# Patient Record
Sex: Male | Born: 1975 | Race: Black or African American | Hispanic: No | Marital: Single | Smoking: Current some day smoker
Health system: Southern US, Community
[De-identification: ages and names within clinical notes are randomized; demographics above are authoritative.]

---

## 1998-01-25 ENCOUNTER — Emergency Department (HOSPITAL_COMMUNITY): Admission: EM | Admit: 1998-01-25 | Discharge: 1998-01-25 | Payer: Self-pay | Admitting: Emergency Medicine

## 1998-02-06 ENCOUNTER — Emergency Department (HOSPITAL_COMMUNITY): Admission: EM | Admit: 1998-02-06 | Discharge: 1998-02-06 | Payer: Self-pay | Admitting: Emergency Medicine

## 2005-11-11 ENCOUNTER — Emergency Department: Payer: Self-pay | Admitting: Unknown Physician Specialty

## 2011-01-09 ENCOUNTER — Emergency Department (HOSPITAL_COMMUNITY)
Admission: EM | Admit: 2011-01-09 | Discharge: 2011-01-10 | Disposition: A | Discharge status: Home / Self Care | Payer: Self-pay | Attending: Emergency Medicine | Admitting: Emergency Medicine

## 2011-01-09 DIAGNOSIS — R110 Nausea: Secondary | ICD-10-CM | POA: Insufficient documentation

## 2011-01-09 DIAGNOSIS — H81399 Other peripheral vertigo, unspecified ear: Secondary | ICD-10-CM | POA: Insufficient documentation

## 2011-01-09 LAB — CBC
Hemoglobin: 15.4 g/dL (ref 13.0–17.0)
MCH: 32.6 pg (ref 26.0–34.0)
MCV: 92.4 fL (ref 78.0–100.0)
RBC: 4.73 MIL/uL (ref 4.22–5.81)

## 2011-01-09 LAB — DIFFERENTIAL
Lymphs Abs: 3.7 10*3/uL (ref 0.7–4.0)
Monocytes Relative: 9 % (ref 3–12)
Neutro Abs: 2.2 10*3/uL (ref 1.7–7.7)
Neutrophils Relative %: 33 % — ABNORMAL LOW (ref 43–77)

## 2011-01-09 LAB — POCT I-STAT, CHEM 8
BUN: 11 mg/dL (ref 6–23)
Calcium, Ion: 1.15 mmol/L (ref 1.12–1.32)
Chloride: 105 mEq/L (ref 96–112)
Creatinine, Ser: 1.2 mg/dL (ref 0.4–1.5)

## 2011-01-09 LAB — POCT CARDIAC MARKERS: Troponin i, poc: <0.05 ng/mL (ref 0.00–0.09)

## 2011-08-31 ENCOUNTER — Emergency Department (HOSPITAL_COMMUNITY)
Admission: EM | Admit: 2011-08-31 | Discharge: 2011-08-31 | Disposition: A | Discharge status: Home / Self Care | Payer: Self-pay | Attending: Emergency Medicine | Admitting: Emergency Medicine

## 2011-08-31 ENCOUNTER — Encounter (HOSPITAL_COMMUNITY): Payer: Self-pay | Admitting: Emergency Medicine

## 2011-08-31 ENCOUNTER — Encounter: Payer: Self-pay | Admitting: Emergency Medicine

## 2011-08-31 DIAGNOSIS — R21 Rash and other nonspecific skin eruption: Secondary | ICD-10-CM | POA: Insufficient documentation

## 2011-08-31 DIAGNOSIS — B86 Scabies: Secondary | ICD-10-CM | POA: Insufficient documentation

## 2011-08-31 DIAGNOSIS — F17200 Nicotine dependence, unspecified, uncomplicated: Secondary | ICD-10-CM | POA: Insufficient documentation

## 2011-08-31 DIAGNOSIS — L299 Pruritus, unspecified: Secondary | ICD-10-CM | POA: Insufficient documentation

## 2011-08-31 MED ORDER — PERMETHRIN 5 % EX CREA: TOPICAL_CREAM | CUTANEOUS | Status: DC

## 2011-08-31 NOTE — ED Notes (Signed)
Patient stable and awaiting to be seen by md

## 2011-08-31 NOTE — ED Provider Notes (Signed)
History     CSN: [id number] Arrival date & time: 08/31/2011  2:44 AM   First MD Initiated Contact with Patient 08/31/11 0341      Chief Complaint  Patient presents with  . Rash    Patient is a 35 y.o. male presenting with rash.  Rash    History provided by the patient. Patient presents with complaints of pruritic rash to bilateral forearms and hands for the past few days. Patient denies any new foods, soaps, laundry detergent, clothing, jewelry. No known tick bites or environmental exposures to poison ivy or poison oak.  No pets in the home.  No other family members with similar symptoms. She denies fever, chills, sweats.  No chest tightness or shortness of breath.  History reviewed. No pertinent past medical history.  History reviewed. No pertinent past surgical history.  Family History  Problem Relation Age of Onset  . Diabetes Father     History  Substance Use Topics  . Smoking status: Current Everyday Smoker    Types: Cigars  . Smokeless tobacco: Not on file  . Alcohol Use: Yes     socially      Review of Systems  Constitutional: Negative for fever and chills.  Respiratory: Negative for shortness of breath, wheezing and stridor.   Cardiovascular: Negative for chest pain.  Skin: Positive for rash.  Neurological: Negative for headaches.  All other systems reviewed and are negative.    Allergies  Review of patient's allergies indicates no known allergies.  Home Medications  No current outpatient prescriptions on file.  BP 122/81  Pulse 81  Temp(Src) 98.5 F (36.9 C) (Oral)  Resp 20  SpO2 97%  Physical Exam  Constitutional: He is oriented to person, place, and time. He appears well-developed and well-nourished. No distress.  Cardiovascular: Normal rate.   No murmur heard. Pulmonary/Chest: Effort normal. No stridor.  Lymphadenopathy:    He has no cervical adenopathy.  Neurological: He is alert and oriented to person, place, and time.  Skin: Skin is  warm.       Multiple papular lesions on bilateral forearms following a linear pattern.  Similar smaller papules between webspace and fingers.  Skin otherwise normal. No erythema or induration. No erythematous streaks.  Psychiatric: He has a normal mood and affect.    ED Course  Procedures (including critical care time)  1. Scabies   2. Rash       MDM          Angus Seller, [place] 08/31/11 [phone]

## 2011-08-31 NOTE — ED Notes (Signed)
+   GC /Chlamydia Patient treated with Rocephin and Zithromax. DHHS letter faxed . Call and notify patient.

## 2011-08-31 NOTE — ED Notes (Signed)
Patient stable at discharge and discharged home.  States understanding of paperwork

## 2011-08-31 NOTE — ED Provider Notes (Signed)
Medical screening examination/treatment/procedure(s) were performed by non-physician practitioner and as supervising physician I was immediately available for consultation/collaboration.  Olivia Mackie, MD 08/31/11 [phone]

## 2011-08-31 NOTE — ED Notes (Signed)
Pt states Sunday he had a fine red rash here and there that itches  Pt states the rash has progressively gotten worse since  Pt states it is mostly on his arms

## 2011-09-03 ENCOUNTER — Emergency Department (HOSPITAL_COMMUNITY)
Admission: EM | Admit: 2011-09-03 | Discharge: 2011-09-04 | Disposition: A | Discharge status: Home / Self Care | Payer: Self-pay | Attending: Emergency Medicine | Admitting: Emergency Medicine

## 2011-09-03 ENCOUNTER — Encounter (HOSPITAL_COMMUNITY): Payer: Self-pay | Admitting: Emergency Medicine

## 2011-09-03 DIAGNOSIS — R21 Rash and other nonspecific skin eruption: Secondary | ICD-10-CM | POA: Insufficient documentation

## 2011-09-03 NOTE — ED Notes (Signed)
Pt. reports itchy rashes at both arms x 5days , denies new meds/no new food . Rsspirations unlabored . Prescribed with lotion at an urgent care last Friday with improvement.

## 2011-09-04 NOTE — ED Provider Notes (Signed)
History     CSN: [id number] Arrival date & time: 09/03/2011 10:11 PM   First MD Initiated Contact with Patient 09/03/11 2352      Chief Complaint  Patient presents with  . Rash    (Consider location/radiation/quality/duration/timing/severity/associated sxs/prior treatment) HPI 35 year old male presents to emergency department with report of resolving rash. Patient was seen emergency department on Friday with diagnosis scabies, applied permethrin cream on Saturday. Patient attempted to return to work today but was told that he needed a repeat visit and clearance prior to return to work. Patient reports rash has resolved, no further itching and lesions are drying up. He has no other complaints History reviewed. No pertinent past medical history.  History reviewed. No pertinent past surgical history.  Family History  Problem Relation Age of Onset  . Diabetes Father     History  Substance Use Topics  . Smoking status: Current Everyday Smoker    Types: Cigars  . Smokeless tobacco: Not on file  . Alcohol Use: Yes     socially      Review of Systems  All other systems reviewed and are negative.    Allergies  Review of patient's allergies indicates no known allergies.  Home Medications  No current outpatient prescriptions on file.  BP 123/79  Pulse 82  Temp(Src) 98.3 F (36.8 C) (Oral)  Resp 16  SpO2 97%  Physical Exam  Nursing note and vitals reviewed. Skin: Skin is warm and dry.       Patient with healing maculopapular rash to bilateral forearms. No new lesions noted no secondary infection    ED Course  Procedures (including critical care time)  Labs Reviewed - No data to display No results found.   No diagnosis found.    MDM  35 rolled male with resolving scabies infection. Will write patient a note for work        Olivia Mackie, MD 09/04/11 [phone]

## 2015-11-27 ENCOUNTER — Encounter (HOSPITAL_BASED_OUTPATIENT_CLINIC_OR_DEPARTMENT_OTHER): Payer: Self-pay | Admitting: *Deleted

## 2015-11-27 ENCOUNTER — Emergency Department (HOSPITAL_BASED_OUTPATIENT_CLINIC_OR_DEPARTMENT_OTHER)
Admission: EM | Admit: 2015-11-27 | Discharge: 2015-11-27 | Disposition: A | Discharge status: Home / Self Care | Payer: BLUE CROSS/BLUE SHIELD | Attending: Emergency Medicine | Admitting: Emergency Medicine

## 2015-11-27 DIAGNOSIS — R51 Headache: Secondary | ICD-10-CM | POA: Diagnosis not present

## 2015-11-27 DIAGNOSIS — F17210 Nicotine dependence, cigarettes, uncomplicated: Secondary | ICD-10-CM | POA: Insufficient documentation

## 2015-11-27 DIAGNOSIS — R519 Headache, unspecified: Secondary | ICD-10-CM

## 2015-11-27 MED ORDER — BUTALBITAL-APAP-CAFFEINE 50-325-40 MG PO TABS: 1.0000 | ORAL_TABLET | Freq: Four times a day (QID) | ORAL | Status: AC | PRN

## 2015-11-27 MED ORDER — KETOROLAC TROMETHAMINE 60 MG/2ML IM SOLN
60.0000 mg | Freq: Once | INTRAMUSCULAR | Status: AC
  Administered 2015-11-27: 60 mg via INTRAMUSCULAR
  Filled 2015-11-27: qty 2

## 2015-11-27 NOTE — ED Notes (Signed)
Headache since Thursday. 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

## 2015-11-27 NOTE — ED Provider Notes (Signed)
CSN: [id number]     Arrival date & time 11/27/15  1144 History   First MD Initiated Contact with Patient 11/27/15 1233     Chief Complaint  Patient presents with  . Headache     (Consider location/radiation/quality/duration/timing/severity/associated sxs/prior Treatment) HPI   40 year old male presenting for evaluation of a headache. Patient states since he started a new job approximately a year ago he has had recurrent headache. 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 He normally does not have any headache prior to this new job. His most recent headache has been ongoing for the past 4 days. He described a gradual onset of sharp throbbing pain to his frontal region and radiated as 6 out of 10 which has been persistent. Headaches shortly started after he went to work and did recall an exposed to a lot of burning fumes. He has not been back to work for the past 2 days but headache has not resolved. He did try taking ibuprofen last night without adequate relief. No complaints of fever, vision changes, URI symptoms, chest pain, shortness of breath, focal numbness or weakness, or rash. Denies any nausea vomiting or diarrhea. In the past this headache usually lasting from 3-7 days and this recurred every 3-4 months. This is the third time that he has this type of headache.      History reviewed. No pertinent past medical history. History reviewed. No pertinent past surgical history. Family History  Problem Relation Age of Onset  . Diabetes Father    Social History  Substance Use Topics  . Smoking status: Current Every Day Smoker    Types: Cigars  . Smokeless tobacco: Never Used  . Alcohol Use: Yes     Comment: socially    Review of Systems  All other systems reviewed and are negative.     Allergies  Review of patient's allergies indicates no known allergies.  Home Medications   Prior to Admission medications   Not on File   BP 136/79 mmHg   Pulse 78  Temp(Src) 98.7 F (37.1 C) (Oral)  Resp 18  Ht  (1.803 m)  Wt 135.172 kg  BMI 41.58 kg/m2  SpO2 100% Physical Exam  Constitutional: He is oriented to person, place, and time. He appears well-developed and well-nourished. No distress.  African-American male resting comfortably in no acute distress and nontoxic in appearance  HENT:  Head: Atraumatic.  Right Ear: External ear normal.  Left Ear: External ear normal.  Nose: Nose normal.  Mouth/Throat: Oropharynx is clear and moist. No oropharyngeal exudate.  Eyes: Conjunctivae and EOM are normal. Pupils are equal, round, and reactive to light.  Neck: Normal range of motion. Neck supple.  No nuchal rigidity  Cardiovascular: Normal rate and regular rhythm.   Pulmonary/Chest: Effort normal and breath sounds normal.  Abdominal: Soft. There is no tenderness.  Neurological: He is alert and oriented to person, place, and time. He has normal strength. No cranial nerve deficit or sensory deficit. GCS eye subscore is 4. GCS verbal subscore is 5. GCS motor subscore is 6.  Skin: No rash noted.  Psychiatric: He has a normal mood and affect.  Nursing note and vitals reviewed.   ED Course  Procedures (including critical care time)   MDM   Final diagnoses:  Bad headache    BP 136/79 mmHg  Pulse 78  Temp(Src) 98.7 F (37.1 C) (Oral)  Resp 18  Ht  (1.803 m)  Wt  135.172 kg  BMI 41.58 kg/m2  SpO2 100%  Headache similar to previous, no fever, neck stiffness, neuro findings or new symptoms to suggest more serious etiology.  I don't think SAH, ICH, meningitis, encephalitis, mass at this time.  No recent trauma.  I don't feel imaging necessary at this time.  Plan to control symptoms.   Fayrene Helper, PA-C 11/27/15 1309  Rolland Porter, MD 11/30/15 [phone]

## 2015-11-27 NOTE — Discharge Instructions (Signed)
Sinus Headache  A sinus headache happens when your sinuses become clogged or swollen. You may feel pain or pressure in your face, forehead, ears, or upper teeth. Sinus headaches can be mild or severe.  HOME CARE  · Take medicines only as told by your doctor.  · If you were given an antibiotic medicine, finish all of it even if you start to feel better.  · Use a nose spray if you feel stuffed up (congested).  · If told, apply a warm, moist washcloth to your face to help lessen pain.  GET HELP IF:  · You get headaches more than one time each week.  · Light or sound bothers you.  · You have a fever.  · You feel sick to your stomach (nauseous) or you throw up (vomit).  · Your headaches do not get better with treatment.  GET HELP RIGHT AWAY IF:  · You have trouble seeing.  · You suddenly have very bad pain in your face or head.  · You start to twitch or shake (seizure).  · You are confused.  · You have a stiff neck.     This information is not intended to replace advice given to you by your health care provider. Make sure you discuss any questions you have with your health care provider.     Document Released: 02/07/2011 Document Revised: 02/22/2015 Document Reviewed: 10/04/2014  Elsevier Interactive Patient Education ©2016 [employer]

## 2015-11-27 NOTE — ED Notes (Signed)
Pt agreeable to sitting in hallway until shot time expires.

## 2017-01-17 ENCOUNTER — Ambulatory Visit (HOSPITAL_COMMUNITY)
Admission: EM | Admit: 2017-01-17 | Discharge: 2017-01-17 | Disposition: A | Discharge status: Home / Self Care | Payer: BLUE CROSS/BLUE SHIELD | Attending: Family Medicine | Admitting: Family Medicine

## 2017-01-17 ENCOUNTER — Encounter (HOSPITAL_COMMUNITY): Payer: Self-pay | Admitting: Emergency Medicine

## 2017-01-17 DIAGNOSIS — R110 Nausea: Secondary | ICD-10-CM

## 2017-01-17 DIAGNOSIS — R059 Cough, unspecified: Secondary | ICD-10-CM

## 2017-01-17 DIAGNOSIS — B349 Viral infection, unspecified: Secondary | ICD-10-CM

## 2017-01-17 DIAGNOSIS — R05 Cough: Secondary | ICD-10-CM

## 2017-01-17 MED ORDER — ONDANSETRON 4 MG PO TBDP: 4.0000 mg | ORAL_TABLET | Freq: Three times a day (TID) | ORAL | 0 refills | Status: DC | PRN

## 2017-01-17 MED ORDER — BENZONATATE 100 MG PO CAPS: 100.0000 mg | ORAL_CAPSULE | Freq: Three times a day (TID) | ORAL | 0 refills | Status: DC

## 2017-01-17 MED ORDER — IPRATROPIUM BROMIDE 0.06 % NA SOLN: 2.0000 | Freq: Four times a day (QID) | NASAL | 0 refills | Status: DC

## 2017-01-17 NOTE — ED Provider Notes (Signed)
CSN: [id number]     Arrival date & time 01/17/17  1606 History   First MD Initiated Contact with Patient 01/17/17 1645     Chief Complaint  Patient presents with  . URI   (Consider location/radiation/quality/duration/timing/severity/associated sxs/prior Treatment) Patient c/o URI sx's and nausea yesterday.  He has a cough, he has some congestion.  He states he has not eaten.   The history is provided by the patient.  URI  Presenting symptoms: cough, fatigue and rhinorrhea   Severity:  Moderate Onset quality:  Sudden Duration:  2 days Timing:  Constant Progression:  Worsening Chronicity:  New Relieved by:  Nothing Worsened by:  Nothing Ineffective treatments:  None tried Associated symptoms: sneezing     History reviewed. No pertinent past medical history. History reviewed. No pertinent surgical history. Family History  Problem Relation Age of Onset  . Diabetes Father    Social History  Substance Use Topics  . Smoking status: Current Every Day Smoker    Types: Cigars  . Smokeless tobacco: Never Used  . Alcohol use Yes     Comment: socially    Review of Systems  Constitutional: Positive for fatigue.  HENT: Positive for rhinorrhea and sneezing.   Eyes: Negative.   Respiratory: Positive for cough.   Cardiovascular: Negative.   Gastrointestinal: Negative.   Endocrine: Negative.   Genitourinary: Negative.   Musculoskeletal: Negative.   Allergic/Immunologic: Negative.   Neurological: Negative.   Hematological: Negative.   Psychiatric/Behavioral: Negative.     Allergies  Patient has no known allergies.  Home Medications   Prior to Admission medications   Medication Sig Start Date End Date Taking? Authorizing Provider  benzonatate (TESSALON) 100 MG capsule Take 1 capsule (100 mg total) by mouth every 8 (eight) hours. 01/17/17   Deatra Canter, FNP  ipratropium (ATROVENT) 0.06 % nasal spray Place 2 sprays into both nostrils 4 (four) times daily. 01/17/17    Deatra Canter, FNP  ondansetron (ZOFRAN ODT) 4 MG disintegrating tablet Take 1 tablet (4 mg total) by mouth every 8 (eight) hours as needed for nausea or vomiting. 01/17/17   Deatra Canter, FNP   Meds Ordered and Administered this Visit  Medications - No data to display  BP 123/67 (BP Location: Right Arm) Comment (BP Location): large cuff  Pulse 84   Temp 99.1 F (37.3 C) (Oral)   Resp (!) 22   SpO2 96%  No data found.   Physical Exam  Constitutional: He is oriented to person, place, and time. He appears well-developed and well-nourished.  HENT:  Head: Normocephalic and atraumatic.  Right Ear: External ear normal.  Left Ear: External ear normal.  Mouth/Throat: Oropharynx is clear and moist.  Eyes: Conjunctivae and EOM are normal. Pupils are equal, round, and reactive to light.  Neck: Normal range of motion. Neck supple.  Cardiovascular: Normal rate, regular rhythm and normal heart sounds.   Pulmonary/Chest: Effort normal and breath sounds normal.  Neurological: He is alert and oriented to person, place, and time.  Nursing note and vitals reviewed.   Urgent Care Course     Procedures (including critical care time)  Labs Review Labs Reviewed - No data to display  Imaging Review No results found.   Visual Acuity Review  Right Eye Distance:   Left Eye Distance:   Bilateral Distance:    Right Eye Near:   Left Eye Near:    Bilateral Near:         MDM   1.  Nausea   2. Viral syndrome   3. Cough    Atrovent Nasal Spray Zofran ODT 4mg  one po tid prn   Push po fluids, rest, tylenol and motrin otc prn as directed for fever, arthralgias, and myalgias.  Follow up prn if sx's continue or persist.    Deatra CanterWilliam J Zoiey Christy, FNP 01/17/17 [phone]

## 2017-01-17 NOTE — ED Triage Notes (Signed)
Symptoms started on Tuesday, head congestion, sore throat.  Yesterday had one episode of vomiting.

## 2017-01-22 ENCOUNTER — Ambulatory Visit (HOSPITAL_COMMUNITY)
Admission: EM | Admit: 2017-01-22 | Discharge: 2017-01-22 | Disposition: A | Discharge status: Home / Self Care | Payer: BLUE CROSS/BLUE SHIELD | Attending: Family Medicine | Admitting: Family Medicine

## 2017-01-22 ENCOUNTER — Encounter (HOSPITAL_COMMUNITY): Payer: Self-pay | Admitting: *Deleted

## 2017-01-22 DIAGNOSIS — R0981 Nasal congestion: Secondary | ICD-10-CM | POA: Diagnosis not present

## 2017-01-22 DIAGNOSIS — R05 Cough: Secondary | ICD-10-CM

## 2017-01-22 DIAGNOSIS — R112 Nausea with vomiting, unspecified: Secondary | ICD-10-CM | POA: Diagnosis not present

## 2017-01-22 LAB — POCT I-STAT, CHEM 8
BUN: 10 mg/dL (ref 6–20)
CHLORIDE: 105 mmol/L (ref 101–111)
CREATININE: 0.8 mg/dL (ref 0.61–1.24)
Calcium, Ion: 1.25 mmol/L (ref 1.15–1.40)
Glucose, Bld: 94 mg/dL (ref 65–99)
HEMATOCRIT: 47 % (ref 39.0–52.0)
Hemoglobin: 16 g/dL (ref 13.0–17.0)
POTASSIUM: 4.2 mmol/L (ref 3.5–5.1)
Sodium: 143 mmol/L (ref 135–145)
TCO2: 27 mmol/L (ref 0–100)

## 2017-01-22 LAB — POCT URINALYSIS DIP (DEVICE)
BILIRUBIN URINE: NEGATIVE
GLUCOSE, UA: NEGATIVE mg/dL
HGB URINE DIPSTICK: NEGATIVE
Ketones, ur: NEGATIVE mg/dL
LEUKOCYTES UA: NEGATIVE
Nitrite: NEGATIVE
Protein, ur: NEGATIVE mg/dL
Urobilinogen, UA: 1 mg/dL (ref 0.0–1.0)
pH: 5.5 (ref 5.0–8.0)

## 2017-01-22 MED ORDER — PROMETHAZINE HCL 12.5 MG PO TABS: 12.5000 mg | ORAL_TABLET | Freq: Four times a day (QID) | ORAL | 0 refills | Status: DC | PRN

## 2017-01-22 NOTE — ED Provider Notes (Signed)
CSN: [id number]     Arrival date & time 01/22/17  1722 History   First MD Initiated Contact with Patient 01/22/17 1758     Chief Complaint  Patient presents with  . Nausea   (Consider location/radiation/quality/duration/timing/severity/associated sxs/prior Treatment) HPI Patient returns today with complaint of nausea. Was seen here the urgent care last week and treated for varus syndrome and nausea. States that his cough and congestion have improved but he continues to have nausea. Last episode of vomiting was yesterday morning. States that he has been limiting his oral intake due to concerns of vomiting. Has had a slight headache. Denies photophobia or vision changes. Denies abdominal pain/cramping. Last bowel movement was Saturday and states that it was normal. No diarrhea. Denies dysuria hematuria. Does state that his urine is dark yellow. Denies fever chills. History reviewed. No pertinent past medical history. History reviewed. No pertinent surgical history. Family History  Problem Relation Age of Onset  . Diabetes Father    Social History  Substance Use Topics  . Smoking status: Current Every Day Smoker    Types: Cigars  . Smokeless tobacco: Never Used  . Alcohol use Yes     Comment: socially    Review of Systems  Constitutional: Positive for activity change. Negative for chills and fever.  HENT: Positive for congestion (Improved). Negative for sinus pain, sinus pressure and sore throat.   Eyes: Negative for photophobia and visual disturbance.  Respiratory: Positive for cough (Slight but improved.). Negative for chest tightness and shortness of breath.   Gastrointestinal: Positive for nausea and vomiting. Negative for abdominal pain, blood in stool, constipation and diarrhea.  Genitourinary: Negative for dysuria, flank pain and hematuria.  Musculoskeletal: Negative.   Neurological: Negative.   Psychiatric/Behavioral: Negative.     Allergies  Patient has no known  allergies.  Home Medications   Prior to Admission medications   Medication Sig Start Date End Date Taking? Authorizing Provider  benzonatate (TESSALON) 100 MG capsule Take 1 capsule (100 mg total) by mouth every 8 (eight) hours. 01/17/17   Deatra Canter, FNP  ipratropium (ATROVENT) 0.06 % nasal spray Place 2 sprays into both nostrils 4 (four) times daily. 01/17/17   Deatra Canter, FNP  ondansetron (ZOFRAN ODT) 4 MG disintegrating tablet Take 1 tablet (4 mg total) by mouth every 8 (eight) hours as needed for nausea or vomiting. 01/17/17   Deatra Canter, FNP   Meds Ordered and Administered this Visit  Medications - No data to display  BP 118/65 (BP Location: Right Arm)   Pulse 68   Temp 98.8 F (37.1 C) (Oral)   Resp 16   SpO2 97%  No data found.   Physical Exam  Constitutional: He is oriented to person, place, and time. No distress.  HENT:  Head: Normocephalic and atraumatic.  Nose: Nose normal.  Eyes: Conjunctivae and EOM are normal. Pupils are equal, round, and reactive to light.  Neck: Normal range of motion. Neck supple.  Cardiovascular: Normal rate and regular rhythm.   No murmur heard. Pulmonary/Chest: Breath sounds normal. No respiratory distress.  Abdominal: Bowel sounds are normal. He exhibits no distension. There is no tenderness. There is no guarding.  Musculoskeletal: Normal range of motion.  Lymphadenopathy:    He has no cervical adenopathy.  Neurological: He is alert and oriented to person, place, and time.  Skin: Skin is warm and dry.  Psychiatric: He has a normal mood and affect.    Urgent Care Course  Procedures (including critical care time)  Labs Review Labs Reviewed  POCT URINALYSIS DIP (DEVICE)  POCT I-STAT, CHEM 8    Imaging Review No results found.   Visual Acuity Review  Right Eye Distance:   Left Eye Distance:   Bilateral Distance:    Right Eye Near:   Left Eye Near:    Bilateral Near:         MDM   1. Nausea and  vomiting, intractability of vomiting not specified, unspecified vomiting type    Reviewed labs with patient today.  These are all normal. He will discontinue Zofran and we will try Phenergan 12.5 mg by mouth every 6 hours when necessary for nausea. If symptoms worsen he will go to the emergency room for evaluation. States that he does not have a primary care physician and I did encourage him to get one. Continue to stay hydrated. Increase oral intake as tolerated. All questions answered.    Naida Sleight, PA-C 01/22/17 1850

## 2017-01-22 NOTE — ED Triage Notes (Signed)
Pt  Reports      He   Was   Seen       4  Days   Ago          Has  Been   Sick  About  1   Week     Congestion   And  Cough  Is  Better  But  Has  Been  Vomiting   No  Diarrhea       No   Pain  Except  Slight  Headache

## 2017-01-22 NOTE — Discharge Instructions (Signed)
Discontinue Zofran and take Phenergan as prescribed.  Continue to stay hydrated.  If symptoms worsen with fever, chills, abdominal pain or worsening nausea and vomiting you should go immediately to the emergency room for evaluation.

## 2017-03-08 ENCOUNTER — Emergency Department
Admission: EM | Admit: 2017-03-08 | Discharge: 2017-03-08 | Disposition: A | Discharge status: Home / Self Care | Payer: BLUE CROSS/BLUE SHIELD

## 2017-04-23 ENCOUNTER — Emergency Department (HOSPITAL_BASED_OUTPATIENT_CLINIC_OR_DEPARTMENT_OTHER)
Admission: EM | Admit: 2017-04-23 | Discharge: 2017-04-23 | Disposition: A | Discharge status: Home / Self Care | Payer: BLUE CROSS/BLUE SHIELD | Attending: Emergency Medicine | Admitting: Emergency Medicine

## 2017-04-23 ENCOUNTER — Emergency Department (HOSPITAL_COMMUNITY)
Admission: EM | Admit: 2017-04-23 | Discharge: 2017-04-23 | Discharge status: Absent without leave | Payer: BLUE CROSS/BLUE SHIELD | Source: Home / Self Care

## 2017-04-23 ENCOUNTER — Emergency Department (HOSPITAL_BASED_OUTPATIENT_CLINIC_OR_DEPARTMENT_OTHER): Payer: BLUE CROSS/BLUE SHIELD

## 2017-04-23 ENCOUNTER — Encounter (HOSPITAL_BASED_OUTPATIENT_CLINIC_OR_DEPARTMENT_OTHER): Payer: Self-pay | Admitting: Emergency Medicine

## 2017-04-23 DIAGNOSIS — F17290 Nicotine dependence, other tobacco product, uncomplicated: Secondary | ICD-10-CM | POA: Insufficient documentation

## 2017-04-23 DIAGNOSIS — M25561 Pain in right knee: Secondary | ICD-10-CM | POA: Diagnosis present

## 2017-04-23 MED ORDER — NAPROXEN 375 MG PO TABS
ORAL_TABLET | ORAL | 0 refills | Status: DC
Start: 2017-04-23 — End: 2018-12-09

## 2017-04-23 MED ORDER — NAPROXEN 250 MG PO TABS
500.0000 mg | ORAL_TABLET | Freq: Once | ORAL | Status: AC
  Administered 2017-04-23: 500 mg via ORAL
  Filled 2017-04-23: qty 2

## 2017-04-23 NOTE — ED Triage Notes (Signed)
Pt c/o right knee pain x 1 week. No known injury. Pain is worse with weight bearing.

## 2017-04-23 NOTE — ED Notes (Signed)
ED Provider at bedside. 

## 2017-04-23 NOTE — ED Notes (Signed)
C/o rt knee pain x 1 1/2 weeks   Denies inj,  Has pain w weight bearing

## 2017-04-23 NOTE — ED Provider Notes (Signed)
[facility] Provider Note: Ethan Dell, MD, FACEP  CSN: [id number] MRN: [id number] ARRIVAL: 04/23/17 at 0124 ROOM: [facility]   CHIEF COMPLAINT  Knee Pain   HISTORY OF PRESENT ILLNESS  Ethan Beck is a 41 y.o. male with a 1-1/2 week history of pain in his right knee. The onset was gradual. The pain is felt in the medial aspect of the knee. Pain is worse with weightbearing and ambulation. He denies inciting trauma. There is no associated instability, deformity, swelling or redness. He rates his pain as a 7 out of 10 at its worst. He has been taking Excedrin with partial relief.  Consultation with the [facility] state controlled substances database reveals the patient has received no opioid prescriptions in the past year.   History reviewed. No pertinent past medical history.  History reviewed. No pertinent surgical history.  Family History  Problem Relation Age of Onset  . Diabetes Father     Social History  Substance Use Topics  . Smoking status: Current Every Day Smoker    Types: Cigars  . Smokeless tobacco: Never Used  . Alcohol use Yes     Comment: socially    Prior to Admission medications   Not on File    Allergies Patient has no known allergies.   REVIEW OF SYSTEMS  Negative except as noted here or in the History of Present Illness.   PHYSICAL EXAMINATION  Initial Vital Signs Blood pressure 126/71, pulse 77, temperature 98.6 F (37 C), temperature source Oral, resp. rate 18, height 6' (1.829 m), weight 127 kg (280 lb), SpO2 100 %.  Examination General: Well-developed, well-nourished male in no acute distress; appearance consistent with age of record HENT: normocephalic; atraumatic Eyes: pupils equal, round and reactive to light; extraocular muscles intact Neck: supple Heart: regular rate and rhythm Lungs: clear to auscultation bilaterally Abdomen: soft; nondistended; nontender; bowel sounds present Extremities: No deformity; full  range of motion; pulses normal; right knee stable, nontender except for mild pain on anterior drawer test, no effusion Neurologic: Awake, alert and oriented; motor function intact in all extremities and symmetric; no facial droop Skin: Warm and dry Psychiatric: Normal mood and affect   RESULTS  Summary of this visit's results, reviewed by myself:   EKG Interpretation  Date/Time:    Ventricular Rate:    PR Interval:    QRS Duration:   QT Interval:    QTC Calculation:   R Axis:     Text Interpretation:        Laboratory Studies: No results found for this or any previous visit (from the past 24 hour(s)). Imaging Studies: Dg Knee Complete 4 Views Right  Result Date: 04/23/2017 CLINICAL DATA:  Acute onset of medial right knee pain. Initial encounter. EXAM: RIGHT KNEE - COMPLETE 4+ VIEW COMPARISON:  None. FINDINGS: There is no evidence of fracture or dislocation. The joint spaces are preserved. No significant degenerative change is seen; the patellofemoral joint is grossly unremarkable in appearance. 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 No significant joint effusion is seen. The visualized soft tissues are normal in appearance. IMPRESSION: 1. No evidence of acute fracture or dislocation. 2. Apparent bipartite patella, with a smaller lateral fragment. Electronically Signed   By: Ethan Beck M.D.   On: 04/23/2017 02:11    ED COURSE  Nursing notes and initial vitals signs, including pulse oximetry, reviewed.  Vitals:   04/23/17 0131  BP: 126/71  Pulse: 77  Resp: 18  Temp: 98.6 F (37 C)  TempSrc: Oral  SpO2: 100%  Weight: 127 kg (280 lb)  Height: 6' (1.829 m)   We'll place an knee immobilizer and treat with NSAID. Will refer to sports medicine for further evaluation.  PROCEDURES    ED DIAGNOSES     ICD-10-CM   1. Acute pain of right knee M25.561        Ethan Beck, Ethan RuizJohn, MD 04/23/17 0330

## 2017-05-07 ENCOUNTER — Encounter: Payer: Self-pay | Admitting: Family Medicine

## 2017-05-07 ENCOUNTER — Ambulatory Visit (INDEPENDENT_AMBULATORY_CARE_PROVIDER_SITE_OTHER): Payer: BLUE CROSS/BLUE SHIELD | Admitting: Family Medicine

## 2017-05-07 DIAGNOSIS — M25561 Pain in right knee: Secondary | ICD-10-CM | POA: Diagnosis not present

## 2017-05-07 NOTE — Progress Notes (Signed)
PCP: Patient, No Pcp Per  Subjective:   HPI: Patient is a 41 y.o. male here for right knee pain.  Patient reports he's had anteromedial right knee pain since around 6/22. No trauma or injury. No increase in activity level. Pain level 0/10 but gets soreness with a lot of walking. Stiffness with prolonged sitting or lying down. Given immobilizer for this knee but couldn't tolerate. No skin changes, numbness.  No past medical history on file.  Current Outpatient Prescriptions on File Prior to Visit  Medication Sig Dispense Refill  . naproxen (NAPROSYN) 375 MG tablet Take one tablet twice daily with food as needed for knee pain. 20 tablet 0   No current facility-administered medications on file prior to visit.     No past surgical history on file.  No Known Allergies  Social History   Social History  . Marital status: Single    Spouse name: N/A  . Number of children: N/A  . Years of education: N/A   Occupational History  . Not on file.   Social History Main Topics  . Smoking status: Current Every Day Smoker    Types: Cigars  . Smokeless tobacco: Never Used  . Alcohol use Yes     Comment: socially  . Drug use: No  . Sexual activity: Not on file   Other Topics Concern  . Not on file   Social History Narrative  . No narrative on file    Family History  Problem Relation Age of Onset  . Diabetes Father     BP 126/83   Pulse 78   Ht 6' (1.829 m)   Wt 280 lb (127 kg)   BMI 37.97 kg/m   Review of Systems: See HPI above.     Objective:  Physical Exam:  Gen: NAD, comfortable in exam room  Right knee: No gross deformity, ecchymoses, swelling. Mild TTP pes bursa and proximal to this.  No joint line, other tenderness. FROM. Negative ant/post drawers. Negative valgus/varus testing. Negative lachmanns. Negative mcmurrays, apleys, patellar apprehension. NV intact distally.  Left knee: FROM without pain.   Assessment & Plan:  1. Right knee pain -  consistent with pes bursitis and overuse of these muscles.  Shown home exercises and stretches to do daily.  Icing, aleve.  Consider physical therapy, injection if not improving.  F/u in 6 weeks.

## 2017-05-07 NOTE — Assessment & Plan Note (Signed)
consistent with pes bursitis and overuse of these muscles.  Shown home exercises and stretches to do daily.  Icing, aleve.  Consider physical therapy, injection if not improving.  F/u in 6 weeks.

## 2017-05-07 NOTE — Patient Instructions (Signed)
Your primary issue is pes bursitis. This is an inflammation/overuse of the muscles and bursa (sac of fluid that lubricates them) that insert just below the knee. Do hamstring curls, hacky sack exercise I showed you 3 sets of 10 once a day. Add ankle weight if these become too easy. Icing 15 minutes at a time 3-4 times a day. Aleve 2 tabs twice a day with food for pain and inflammation. Consider physical therapy, injection if not improving as expected. Follow up with me in 6 weeks for reevaluation. Activities as tolerated.  Often this is associated with some very mild arthritis (your x-rays look great though - if you have any of this it is minimal). These are the different medications you can take for this: Tylenol 500mg  1-2 tabs three times a day for pain. Aleve 1-2 tabs twice a day with food Capsaicin, aspercreme, or biofreeze topically up to four times a day may also help with pain. Some supplements that may help for arthritis: Boswellia extract, curcumin, pycnogenol Consider physical therapy to strengthen muscles around the joint that hurts to take pressure off of the joint itself. Shoe inserts with good arch support may be helpful.

## 2017-05-23 ENCOUNTER — Ambulatory Visit: Payer: BLUE CROSS/BLUE SHIELD | Admitting: Family Medicine

## 2017-06-18 ENCOUNTER — Ambulatory Visit: Payer: BLUE CROSS/BLUE SHIELD | Admitting: Family Medicine

## 2017-07-29 ENCOUNTER — Ambulatory Visit (HOSPITAL_COMMUNITY)
Admission: EM | Admit: 2017-07-29 | Discharge: 2017-07-29 | Disposition: A | Discharge status: Home / Self Care | Payer: BLUE CROSS/BLUE SHIELD | Attending: Urgent Care | Admitting: Urgent Care

## 2017-07-29 ENCOUNTER — Encounter (HOSPITAL_COMMUNITY): Payer: Self-pay | Admitting: *Deleted

## 2017-07-29 DIAGNOSIS — S83241S Other tear of medial meniscus, current injury, right knee, sequela: Secondary | ICD-10-CM

## 2017-07-29 DIAGNOSIS — M25561 Pain in right knee: Secondary | ICD-10-CM | POA: Diagnosis not present

## 2017-07-29 DIAGNOSIS — G8929 Other chronic pain: Secondary | ICD-10-CM

## 2017-07-29 DIAGNOSIS — M25461 Effusion, right knee: Secondary | ICD-10-CM

## 2017-07-29 MED ORDER — KETOROLAC TROMETHAMINE 60 MG/2ML IM SOLN
60.0000 mg | Freq: Once | INTRAMUSCULAR | Status: AC
  Administered 2017-07-29: 60 mg via INTRAMUSCULAR

## 2017-07-29 MED ORDER — KETOROLAC TROMETHAMINE 60 MG/2ML IM SOLN
INTRAMUSCULAR | Status: AC
  Filled 2017-07-29: qty 2

## 2017-07-29 MED ORDER — CELECOXIB 100 MG PO CAPS: 100.0000 mg | ORAL_CAPSULE | Freq: Two times a day (BID) | ORAL | 1 refills | Status: DC

## 2017-07-29 NOTE — ED Provider Notes (Signed)
    MRN: [id number] DOB: 1976/01/28  Subjective:   Ethan Beck is a 41 y.o. male presenting for chief complaint of Knee Pain  Reports 2 month history of right knee pain, swelling. Was seen by an orthopedist that diagnosed a small meniscus tear. Has been taking naproxen, Alleve, icing with minimal relief. He has also been using a brace, leg immobilizer. Works in [employer]. Denies HTN, DM, heart disease.  No current facility-administered medications for this encounter.   Current Outpatient Prescriptions:  .  naproxen (NAPROSYN) 375 MG tablet, Take one tablet twice daily with food as needed for knee pain., Disp: 20 tablet, Rfl: 0   [name] has No Known Allergies.  Jamarrion torn meniscus and denies past surgical history.  Objective:   Vitals: BP 107/63 (BP Location: Right Arm)   Pulse 87   Temp 98 F (36.7 C) (Oral)   Resp 16   SpO2 100%   Physical Exam  Constitutional: He is oriented to person, place, and time. He appears well-developed and well-nourished.  Cardiovascular: Normal rate.   Pulmonary/Chest: Effort normal.  Musculoskeletal:       Right knee: He exhibits decreased range of motion (full flexion and extension) and swelling (trace medially). He exhibits no effusion, no ecchymosis, no deformity, no laceration, no erythema, normal alignment, normal patellar mobility and no bony tenderness. Tenderness found. Medial joint line tenderness noted. No lateral joint line and no patellar tendon tenderness noted.  Neurological: He is alert and oriented to person, place, and time.  Skin: Skin is warm and dry.   Assessment and Plan :   Chronic pain of right knee  Tear of medial meniscus of right knee, current, unspecified tear type, sequela  Pain and swelling of right knee  IM Toradol today, start celecoxib. Do no use any other NSAID. Get back with PCP or ortho to discuss disability and management of meniscus tear.  Wallis Bamberg, PA-C [facility]  07/29/2017   10:30 AM    Wallis Bamberg, PA-C 07/29/17 1048

## 2017-07-29 NOTE — ED Triage Notes (Signed)
Patient reports 2 month history of knee pain. States that he has seen ortho for same and diagnosed with small meniscus tear. Reports on Friday knee starting hurting to the point that it was swollen, he couldn't bear weight or bend. Patient has been taking pain medication prescribed by ortho. Patient went to an UC and had fluid drained from knee. Patient has already received steroid shorts for same. 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 Was told to take medication, wear brace and let it rest.

## 2017-07-29 NOTE — Discharge Instructions (Signed)
Icing 20 minutes every 2 hours after work can help with pain and inflammation.

## 2017-12-13 ENCOUNTER — Emergency Department (HOSPITAL_COMMUNITY)
Admission: EM | Admit: 2017-12-13 | Discharge: 2017-12-13 | Disposition: A | Discharge status: Home / Self Care | Payer: BLUE CROSS/BLUE SHIELD | Attending: Emergency Medicine | Admitting: Emergency Medicine

## 2017-12-13 ENCOUNTER — Encounter (HOSPITAL_COMMUNITY): Payer: Self-pay | Admitting: Emergency Medicine

## 2017-12-13 DIAGNOSIS — F17210 Nicotine dependence, cigarettes, uncomplicated: Secondary | ICD-10-CM | POA: Insufficient documentation

## 2017-12-13 DIAGNOSIS — R1111 Vomiting without nausea: Secondary | ICD-10-CM | POA: Insufficient documentation

## 2017-12-13 DIAGNOSIS — J029 Acute pharyngitis, unspecified: Secondary | ICD-10-CM | POA: Insufficient documentation

## 2017-12-13 DIAGNOSIS — R51 Headache: Secondary | ICD-10-CM | POA: Insufficient documentation

## 2017-12-13 MED ORDER — ACETAMINOPHEN 325 MG PO TABS
650.0000 mg | ORAL_TABLET | Freq: Once | ORAL | Status: AC
  Administered 2017-12-13: 650 mg via ORAL
  Filled 2017-12-13: qty 2

## 2017-12-13 MED ORDER — ACETAMINOPHEN 325 MG PO TABS
650.0000 mg | ORAL_TABLET | Freq: Once | ORAL | Status: AC | PRN
  Administered 2017-12-13: 650 mg via ORAL
  Filled 2017-12-13: qty 2

## 2017-12-13 MED ORDER — PROMETHAZINE HCL 25 MG PO TABS: 25.0000 mg | ORAL_TABLET | Freq: Four times a day (QID) | ORAL | 0 refills | Status: DC | PRN

## 2017-12-13 MED ORDER — ONDANSETRON 4 MG PO TBDP
4.0000 mg | ORAL_TABLET | Freq: Once | ORAL | Status: AC
  Administered 2017-12-13: 4 mg via ORAL
  Filled 2017-12-13: qty 1

## 2017-12-13 NOTE — ED Provider Notes (Signed)
[facility]-EMERGENCY DEPT Provider Note   CSN: [id number] Arrival date & time: 12/13/17  1948     History   Chief Complaint Chief Complaint  Patient presents with  . Headache  . Sore Throat   HPI   Blood pressure 124/65, pulse 98, temperature 98.8 F (37.1 C), temperature source Oral, resp. rate 15, SpO2 98 %.  Ethan Beck is a 42 y.o. male complaining of multiple episodes of nonbloody, nonbilious, non-coffee-ground emesis onset several hours ago, he is tolerating p.o.'s.  He denies any sick contacts, abdominal pain, fever chills, change in bowel or bladder habits.  He states that after the vomiting started he has sore throat headache and some back discomfort that is positional.  No pain medication taken prior to arrival.  History reviewed. No pertinent past medical history.  Patient Active Problem List   Diagnosis Date Noted  . Right knee pain 05/07/2017    History reviewed. No pertinent surgical history.     Home Medications    Prior to Admission medications   Medication Sig Start Date End Date Taking? Authorizing Provider  celecoxib (CELEBREX) 100 MG capsule Take 1 capsule (100 mg total) by mouth 2 (two) times daily. 07/29/17   Wallis Bamberg, PA-C  naproxen (NAPROSYN) 375 MG tablet Take one tablet twice daily with food as needed for knee pain. 04/23/17   Molpus, Jonny Ruiz, MD  promethazine (PHENERGAN) 25 MG tablet Take 1 tablet (25 mg total) by mouth every 6 (six) hours as needed for nausea or vomiting. 12/13/17   Zamir Staples, Joni Reining, PA-C    Family History Family History  Problem Relation Age of Onset  . Diabetes Father     Social History Social History   Tobacco Use  . Smoking status: Current Every Day Smoker    Types: Cigars  . Smokeless tobacco: Never Used  Substance Use Topics  . Alcohol use: Yes    Comment: socially  . Drug use: No     Allergies   Patient has no known allergies.   Review of Systems Review of Systems  A  complete review of systems was obtained and all systems are negative except as noted in the HPI and PMH.    Physical Exam Updated Vital Signs BP (!) 147/88 (BP Location: Right Arm)   Pulse 98   Temp 98.8 F (37.1 C) (Oral)   Resp 15   SpO2 98%   Physical Exam  Constitutional: He is oriented to person, place, and time. He appears well-developed and well-nourished. No distress.  HENT:  Head: Normocephalic and atraumatic.  Right Ear: External ear normal.  Left Ear: External ear normal.  Mouth/Throat: Oropharynx is clear and moist. No oropharyngeal exudate.  No drooling or stridor. Posterior pharynx mildly erythematous no significant tonsillar hypertrophy. No exudate. Soft palate rises symmetrically. No TTP or induration under tongue.   No tenderness to palpation of frontal or bilateral maxillary sinuses.  Mild mucosal edema in the nares with scant rhinorrhea.  Bilateral tympanic membranes with normal architecture and good light reflex.    Eyes: Conjunctivae and EOM are normal. Pupils are equal, round, and reactive to light.  Neck: Normal range of motion. Neck supple.  Cardiovascular: Normal rate, regular rhythm and intact distal pulses.  Pulmonary/Chest: Effort normal and breath sounds normal. No stridor. No respiratory distress. He has no wheezes. He has no rales. He exhibits no tenderness.  Abdominal: Soft. There is no tenderness. There is no rebound and no guarding.  Musculoskeletal: Normal range of motion.  Neurological: He is alert and oriented to person, place, and time.  Skin: He is not diaphoretic.  Psychiatric: He has a normal mood and affect.  Nursing note and vitals reviewed.    ED Treatments / Results  Labs (all labs ordered are listed, but only abnormal results are displayed) Labs Reviewed - No data to display  EKG  EKG Interpretation None       Radiology No results found.  Procedures Procedures (including critical care time)  Medications Ordered  in ED Medications  acetaminophen (TYLENOL) tablet 650 mg (650 mg Oral Given 12/13/17 2017)  ondansetron (ZOFRAN-ODT) disintegrating tablet 4 mg (4 mg Oral Given 12/13/17 2224)  acetaminophen (TYLENOL) tablet 650 mg (650 mg Oral Given 12/13/17 2225)     Initial Impression / Assessment and Plan / ED Course  I have reviewed the triage vital signs and the nursing notes.  Pertinent labs & imaging results that were available during my care of the patient were reviewed by me and considered in my medical decision making (see chart for details).     Vitals:   12/13/17 2002 12/13/17 2307  BP: 124/65 (!) 147/88  Pulse: 99 98  Resp: 18 15  Temp: 98.8 F (37.1 C)   TempSrc: Oral   SpO2: 96% 98%    Medications  acetaminophen (TYLENOL) tablet 650 mg (650 mg Oral Given 12/13/17 2017)  ondansetron (ZOFRAN-ODT) disintegrating tablet 4 mg (4 mg Oral Given 12/13/17 2224)  acetaminophen (TYLENOL) tablet 650 mg (650 mg Oral Given 12/13/17 2225)    Ethan Beck is 42 y.o. male presenting with multiple episodes of vomiting he then developed sore throat, hoarse voice and some headache and upper back discomfort.  Likely related to the multiple episodes of emesis.  Vital signs reassuring, patient is tolerating p.o.'s.  Abdominal exam is benign.  Recommend acetaminophen for comfort at home.  Evaluation does not show pathology that would require ongoing emergent intervention or inpatient treatment. Pt is hemodynamically stable and mentating appropriately. Discussed findings and plan with patient/guardian, who agrees with care plan. All questions answered. Return precautions discussed and outpatient follow up given.      Final Clinical Impressions(s) / ED Diagnoses   Final diagnoses:  Non-intractable vomiting without nausea, unspecified vomiting type  Sore throat    ED Discharge Orders        Ordered    promethazine (PHENERGAN) 25 MG tablet  Every 6 hours PRN     12/13/17 2314       Jazmaine Fuelling,  Mardella Laymanicole, PA-C 12/13/17 2356    Rolan BuccoBelfi, Melanie, MD 12/13/17 [phone]

## 2017-12-13 NOTE — ED Notes (Signed)
Bed: WTR7 Expected date:  Expected time:  Means of arrival:  Comments: 

## 2017-12-13 NOTE — Discharge Instructions (Addendum)

## 2017-12-13 NOTE — ED Triage Notes (Signed)
Patient states he was eating lunch earlier then had a few episodes of emesis causing his throat to hurt. Patient states he sat there for about 4 hours and felt better and has been able to drink fluids since then. Denies any nausea or abdominal pain now. C/o headache and sore throat now.

## 2017-12-21 ENCOUNTER — Encounter (HOSPITAL_COMMUNITY): Payer: Self-pay | Admitting: Emergency Medicine

## 2017-12-21 ENCOUNTER — Emergency Department (HOSPITAL_COMMUNITY)
Admission: EM | Admit: 2017-12-21 | Discharge: 2017-12-22 | Disposition: A | Discharge status: Home / Self Care | Payer: Self-pay | Attending: Emergency Medicine | Admitting: Emergency Medicine

## 2017-12-21 DIAGNOSIS — F17290 Nicotine dependence, other tobacco product, uncomplicated: Secondary | ICD-10-CM | POA: Insufficient documentation

## 2017-12-21 DIAGNOSIS — R1114 Bilious vomiting: Secondary | ICD-10-CM | POA: Insufficient documentation

## 2017-12-21 LAB — URINALYSIS, ROUTINE W REFLEX MICROSCOPIC
BACTERIA UA: NONE SEEN
Bilirubin Urine: NEGATIVE
Glucose, UA: NEGATIVE mg/dL
Ketones, ur: NEGATIVE mg/dL
Leukocytes, UA: NEGATIVE
Nitrite: NEGATIVE
Protein, ur: NEGATIVE mg/dL
SPECIFIC GRAVITY, URINE: 1.02 (ref 1.005–1.030)
SQUAMOUS EPITHELIAL / LPF: NONE SEEN
pH: 5 (ref 5.0–8.0)

## 2017-12-21 LAB — COMPREHENSIVE METABOLIC PANEL
ALBUMIN: 4 g/dL (ref 3.5–5.0)
ALT: 31 U/L (ref 17–63)
AST: 29 U/L (ref 15–41)
Alkaline Phosphatase: 72 U/L (ref 38–126)
Anion gap: 9 (ref 5–15)
BUN: 11 mg/dL (ref 6–20)
CHLORIDE: 109 mmol/L (ref 101–111)
CO2: 26 mmol/L (ref 22–32)
CREATININE: 1.11 mg/dL (ref 0.61–1.24)
Calcium: 9.2 mg/dL (ref 8.9–10.3)
GFR calc Af Amer: >60 mL/min (ref >60)
GFR calc non Af Amer: >60 mL/min (ref >60)
GLUCOSE: 106 mg/dL — AB (ref 65–99)
POTASSIUM: 3.5 mmol/L (ref 3.5–5.1)
Sodium: 144 mmol/L (ref 135–145)
Total Bilirubin: 0.5 mg/dL (ref 0.3–1.2)
Total Protein: 7.1 g/dL (ref 6.5–8.1)

## 2017-12-21 LAB — CBC
HEMATOCRIT: 42.4 % (ref 39.0–52.0)
Hemoglobin: 14.2 g/dL (ref 13.0–17.0)
MCH: 32.3 pg (ref 26.0–34.0)
MCHC: 33.5 g/dL (ref 30.0–36.0)
MCV: 96.6 fL (ref 78.0–100.0)
PLATELETS: 213 10*3/uL (ref 150–400)
RBC: 4.39 MIL/uL (ref 4.22–5.81)
RDW: 12.2 % (ref 11.5–15.5)
WBC: 5.9 10*3/uL (ref 4.0–10.5)

## 2017-12-21 LAB — LIPASE, BLOOD: Lipase: 20 U/L (ref 11–51)

## 2017-12-21 MED ORDER — ONDANSETRON HCL 4 MG/2ML IJ SOLN
4.0000 mg | Freq: Once | INTRAMUSCULAR | Status: AC
  Administered 2017-12-22: 4 mg via INTRAVENOUS
  Filled 2017-12-21: qty 2

## 2017-12-21 MED ORDER — SODIUM CHLORIDE 0.9 % IV BOLUS (SEPSIS)
1000.0000 mL | Freq: Once | INTRAVENOUS | Status: AC
  Administered 2017-12-22: 1000 mL via INTRAVENOUS

## 2017-12-21 NOTE — ED Triage Notes (Signed)
Patient c/o vomiting x2 days. Denies abdominal pain and diarrhea.

## 2017-12-22 LAB — ETHANOL: Alcohol, Ethyl (B): <10 mg/dL (ref <10)

## 2017-12-22 MED ORDER — ONDANSETRON 4 MG PO TBDP: 4.0000 mg | ORAL_TABLET | Freq: Three times a day (TID) | ORAL | 0 refills | Status: DC | PRN

## 2017-12-22 NOTE — ED Provider Notes (Signed)
[facility]   CSN: [id number] Arrival date & time: 12/21/17  1923     History   Chief Complaint Chief Complaint  Patient presents with  . Emesis    HPI Ethan Beck is a 42 y.o. male.  HPI Patient presents with concern of ongoing nausea, vomiting. Patient notes that his illness began about 2 days ago, after eating a [place]. He notes that since that time he has had persistent nausea, anorexia, and though he has been tolerant of liquid intake, when he eats solid food, he has vomiting soon thereafter. No pain, no fever, no sustained change in bowel movements over the past 2 days. No medication taken for relief. Patient denies history of abdominal surgery, or obvious sick contacts.  History reviewed. No pertinent past medical history.  Patient Active Problem List   Diagnosis Date Noted  . Right knee pain 05/07/2017    History reviewed. No pertinent surgical history.     Home Medications    Prior to Admission medications   Medication Sig Start Date End Date Taking? Authorizing Provider  celecoxib (CELEBREX) 100 MG capsule Take 1 capsule (100 mg total) by mouth 2 (two) times daily. Patient not taking: Reported on 12/22/2017 07/29/17   Wallis Bamberg, PA-C  naproxen (NAPROSYN) 375 MG tablet Take one tablet twice daily with food as needed for knee pain. Patient not taking: Reported on 12/22/2017 04/23/17   Molpus, Jonny Ruiz, MD  promethazine (PHENERGAN) 25 MG tablet Take 1 tablet (25 mg total) by mouth every 6 (six) hours as needed for nausea or vomiting. Patient not taking: Reported on 12/22/2017 12/13/17   Pisciotta, Joni Reining, PA-C    Family History Family History  Problem Relation Age of Onset  . Diabetes Father     Social History Social History   Tobacco Use  . Smoking status: Current Every Day Smoker    Types: Cigars  . Smokeless tobacco: Never Used  Substance Use Topics  . Alcohol use: Yes    Comment: socially    . Drug use: No     Allergies   Patient has no known allergies.   Review of Systems Review of Systems  Constitutional:       Per HPI, otherwise negative  HENT:       Per HPI, otherwise negative  Respiratory:       Per HPI, otherwise negative  Cardiovascular:       Per HPI, otherwise negative  Gastrointestinal: Positive for nausea and vomiting. Negative for abdominal pain.  Endocrine:       Negative aside from HPI  Genitourinary:       Neg aside from HPI   Musculoskeletal:       Per HPI, otherwise negative  Skin: Negative.   Neurological: Negative for syncope.     Physical Exam Updated Vital Signs BP 138/88 (BP Location: Left Arm)   Pulse 72   Temp 98 F (36.7 C) (Oral)   Resp 14   SpO2 96%   Physical Exam  Constitutional: He is oriented to person, place, and time. He appears well-developed. No distress.  HENT:  Head: Normocephalic and atraumatic.  Eyes: Conjunctivae and EOM are normal.  Cardiovascular: Normal rate and regular rhythm.  Pulmonary/Chest: Effort normal. No stridor. No respiratory distress.  Abdominal: He exhibits no distension and no mass. There is no tenderness. There is no rebound and no guarding.  Musculoskeletal: He exhibits no edema.  Neurological: He is alert and oriented to person, place,  and time.  Skin: Skin is warm and dry.  Psychiatric: He has a normal mood and affect.  Nursing note and vitals reviewed.    ED Treatments / Results  Labs (all labs ordered are listed, but only abnormal results are displayed) Labs Reviewed  COMPREHENSIVE METABOLIC PANEL - Abnormal; Notable for the following components:      Result Value   Glucose, Bld 106 (*)    All other components within normal limits  URINALYSIS, ROUTINE W REFLEX MICROSCOPIC - Abnormal; Notable for the following components:   Hgb urine dipstick SMALL (*)    All other components within normal limits  LIPASE, BLOOD  CBC  ETHANOL    EKG  EKG Interpretation None        Radiology No results found.  Procedures Procedures (including critical care time)  Medications Ordered in ED Medications  sodium chloride 0.9 % bolus 1,000 mL (0 mLs Intravenous Stopped 12/22/17 0124)  ondansetron (ZOFRAN) injection 4 mg (4 mg Intravenous Given 12/22/17 0009)     Initial Impression / Assessment and Plan / ED Course  I have reviewed the triage vital signs and the nursing notes.  Pertinent labs & imaging results that were available during my care of the patient were reviewed by me and considered in my medical decision making (see chart for details).     1:28 AM Patient awake alert, in no distress, no ongoing complaints per We discussed all findings including reassuring labs. No additional vomiting here. Given the absence of ongoing complaints, pain, there is low suspicion for acute abdomen some suspicion for either food borne illness or viral illness. Absent fever, hemodynamic instability, low suspicion for sepsis, bacteremia. Patient discharged in stable condition with outpatient primary care follow-up.  Final Clinical Impressions(s) / ED Diagnoses  Nausea and vomiting   Gerhard MunchLockwood, Melonie Germani, MD 12/22/17 [phone]

## 2017-12-22 NOTE — Discharge Instructions (Signed)
As discussed, your evaluation today has been largely reassuring.  But, it is important that you monitor your condition carefully, and do not hesitate to return to the ED if you develop new, or concerning changes in your condition. ? ?Otherwise, please follow-up with your physician for appropriate ongoing care. ? ?

## 2018-12-09 ENCOUNTER — Ambulatory Visit (HOSPITAL_COMMUNITY)
Admission: EM | Admit: 2018-12-09 | Discharge: 2018-12-09 | Disposition: A | Discharge status: Home / Self Care | Payer: 59 | Attending: Family Medicine | Admitting: Family Medicine

## 2018-12-09 ENCOUNTER — Encounter (HOSPITAL_COMMUNITY): Payer: Self-pay

## 2018-12-09 DIAGNOSIS — B9789 Other viral agents as the cause of diseases classified elsewhere: Secondary | ICD-10-CM

## 2018-12-09 DIAGNOSIS — J069 Acute upper respiratory infection, unspecified: Secondary | ICD-10-CM | POA: Diagnosis not present

## 2018-12-09 MED ORDER — DEXAMETHASONE SODIUM PHOSPHATE 10 MG/ML IJ SOLN
INTRAMUSCULAR | Status: AC
  Filled 2018-12-09: qty 1

## 2018-12-09 MED ORDER — DEXAMETHASONE SODIUM PHOSPHATE 10 MG/ML IJ SOLN
10.0000 mg | Freq: Once | INTRAMUSCULAR | Status: AC
  Administered 2018-12-09: 10 mg via INTRAMUSCULAR

## 2018-12-09 MED ORDER — CETIRIZINE-PSEUDOEPHEDRINE ER 5-120 MG PO TB12: 1.0000 | ORAL_TABLET | Freq: Every day | ORAL | 0 refills | Status: DC

## 2018-12-09 NOTE — Discharge Instructions (Addendum)
I believe you have a viral upper respiratory infection Dexamethasone injection given in clinic for sinus headache and inflammation You can do Zyrtec-D daily to help with symptoms Follow up as needed for continued or worsening symptoms

## 2018-12-09 NOTE — ED Provider Notes (Signed)
[facility]    CSN: [id number] Arrival date & time: 12/09/18  0803     History   Chief Complaint Chief Complaint  Patient presents with  . Cough  . Headache    HPI Ethan Beck is a 43 y.o. male.    Cough  Cough characteristics:  Non-productive Sputum characteristics:  Nondescript Severity:  Mild Onset quality:  Gradual Duration:  5 days Timing:  Constant Progression:  Waxing and waning Chronicity:  New Smoker: no   Context: sick contacts and upper respiratory infection   Context: not animal exposure, not exposure to allergens, not fumes, not occupational exposure, not smoke exposure, not weather changes and not with activity   Relieved by:  Cough suppressants Worsened by:  Nothing Associated symptoms: headaches, rhinorrhea and sinus congestion   Associated symptoms: no chest pain, no chills, no diaphoresis, no ear fullness, no ear pain, no eye discharge, no shortness of breath, no sore throat, no weight loss and no wheezing   Risk factors: no chemical exposure, no recent infection and no recent travel   Headache  Associated symptoms: cough and URI   Associated symptoms: no ear pain and no sore throat   URI  Presenting symptoms: cough and rhinorrhea   Presenting symptoms: no ear pain and no sore throat   Associated symptoms: headaches   Associated symptoms: no wheezing     History reviewed. No pertinent past medical history.  Patient Active Problem List   Diagnosis Date Noted  . Right knee pain 05/07/2017    History reviewed. No pertinent surgical history.     Home Medications    Prior to Admission medications   Medication Sig Start Date End Date Taking? Authorizing Provider  cetirizine-pseudoephedrine (ZYRTEC-D) 5-120 MG tablet Take 1 tablet by mouth daily. 12/09/18   Janace Aris, NP    Family History Family History  Problem Relation Age of Onset  . Diabetes Father     Social History Social History   Tobacco Use  . Smoking  status: Current Every Day Smoker    Types: Cigars  . Smokeless tobacco: Never Used  Substance Use Topics  . Alcohol use: Yes    Comment: socially  . Drug use: No     Allergies   Patient has no known allergies.   Review of Systems Review of Systems  Constitutional: Negative for chills, diaphoresis and weight loss.  HENT: Positive for rhinorrhea. Negative for ear pain and sore throat.   Eyes: Negative for discharge.  Respiratory: Positive for cough. Negative for shortness of breath and wheezing.   Cardiovascular: Negative for chest pain.  Neurological: Positive for headaches.     Physical Exam Triage Vital Signs ED Triage Vitals  Enc Vitals Group     BP 12/09/18 0823 129/75     Pulse Rate 12/09/18 0823 65     Resp 12/09/18 0823 18     Temp 12/09/18 0823 98.2 F (36.8 C)     Temp Source 12/09/18 0823 Oral     SpO2 12/09/18 0823 98 %     Weight --      Height --      Head Circumference --      Peak Flow --      Pain Score 12/09/18 0825 6     Pain Loc --      Pain Edu? --      Excl. in GC? --    No data found.  Updated Vital Signs BP 129/75 (BP Location: Left  Arm)   Pulse 65   Temp 98.2 F (36.8 C) (Oral)   Resp 18   SpO2 98%   Visual Acuity Right Eye Distance:   Left Eye Distance:   Bilateral Distance:    Right Eye Near:   Left Eye Near:    Bilateral Near:     Physical Exam Vitals signs and nursing note reviewed.  Constitutional:      General: He is not in acute distress.    Appearance: He is well-developed. He is not ill-appearing, toxic-appearing or diaphoretic.  HENT:     Head: Normocephalic and atraumatic.     Nose: Congestion present.     Right Turbinates: Swollen.     Left Turbinates: Swollen.     Mouth/Throat:     Mouth: Mucous membranes are moist.     Pharynx: Oropharynx is clear.  Neck:     Musculoskeletal: Normal range of motion.  Cardiovascular:     Rate and Rhythm: Normal rate and regular rhythm.     Heart sounds: Normal heart  sounds.  Pulmonary:     Effort: Pulmonary effort is normal.     Breath sounds: Normal breath sounds.  Musculoskeletal: Normal range of motion.  Skin:    General: Skin is warm and dry.  Neurological:     Mental Status: He is alert.  Psychiatric:        Mood and Affect: Mood normal.      UC Treatments / Results  Labs (all labs ordered are listed, but only abnormal results are displayed) Labs Reviewed - No data to display  EKG None  Radiology No results found.  Procedures Procedures (including critical care time)  Medications Ordered in UC Medications  dexamethasone (DECADRON) injection 10 mg (has no administration in time range)    Initial Impression / Assessment and Plan / UC Course  I have reviewed the triage vital signs and the nursing notes.  Pertinent labs & imaging results that were available during my care of the patient were reviewed by me and considered in my medical decision making (see chart for details).     Symptoms consistent with viral URI We will treat patient sinus inflammation and headache with dexamethasone injection in clinic We will have him take Zyrtec-D and he continue with the over-the-counter cough and congestion medication Follow up as needed for continued or worsening symptoms  Final Clinical Impressions(s) / UC Diagnoses   Final diagnoses:  Viral URI with cough     Discharge Instructions     I believe you have a viral upper respiratory infection Dexamethasone injection given in clinic for sinus headache and inflammation You can do Zyrtec-D daily to help with symptoms Follow up as needed for continued or worsening symptoms    ED Prescriptions    Medication Sig Dispense Auth. Provider   cetirizine-pseudoephedrine (ZYRTEC-D) 5-120 MG tablet Take 1 tablet by mouth daily. 30 tablet Dahlia Byes A, NP     Controlled Substance Prescriptions [place] Controlled Substance Registry consulted? Not Applicable   Janace Aris, NP 12/09/18  [phone]

## 2018-12-09 NOTE — ED Triage Notes (Signed)
Pt presents with productive cough with yellow mucus and ongoing headache X 5 days.

## 2019-01-05 ENCOUNTER — Other Ambulatory Visit: Payer: Self-pay

## 2019-01-05 ENCOUNTER — Ambulatory Visit (HOSPITAL_COMMUNITY)
Admission: EM | Admit: 2019-01-05 | Discharge: 2019-01-05 | Disposition: A | Discharge status: Home / Self Care | Payer: 59 | Attending: Physician Assistant | Admitting: Physician Assistant

## 2019-01-05 ENCOUNTER — Encounter (HOSPITAL_COMMUNITY): Payer: Self-pay

## 2019-01-05 DIAGNOSIS — R112 Nausea with vomiting, unspecified: Secondary | ICD-10-CM | POA: Diagnosis not present

## 2019-01-05 MED ORDER — ONDANSETRON 4 MG PO TBDP
4.0000 mg | ORAL_TABLET | Freq: Once | ORAL | Status: AC
  Administered 2019-01-05: 4 mg via ORAL

## 2019-01-05 MED ORDER — ONDANSETRON 4 MG PO TBDP
ORAL_TABLET | ORAL | Status: AC
  Filled 2019-01-05: qty 1

## 2019-01-05 NOTE — ED Provider Notes (Signed)
[facility]    CSN: [id number] Arrival date & time: 01/05/19  1657     History   Chief Complaint Chief Complaint  Patient presents with  . Emesis    HPI Ethan Beck is a 43 y.o. male.   The history is provided by the patient. No language interpreter was used.  Emesis  Severity:  Moderate Duration:  1 day Timing:  Constant Number of daily episodes:  2 Able to tolerate:  Liquids Progression:  Worsening Chronicity:  New Recent urination:  Normal Relieved by:  Nothing Worsened by:  Nothing Ineffective treatments:  None tried Risk factors: no sick contacts   Pt complains of nausea and vomiting.  Pt reports he became sick this am   History reviewed. No pertinent past medical history.  Patient Active Problem List   Diagnosis Date Noted  . Right knee pain 05/07/2017    History reviewed. No pertinent surgical history.     Home Medications    Prior to Admission medications   Medication Sig Start Date End Date Taking? Authorizing Provider  cetirizine-pseudoephedrine (ZYRTEC-D) 5-120 MG tablet Take 1 tablet by mouth daily. 12/09/18   Janace Aris, NP    Family History Family History  Problem Relation Age of Onset  . Diabetes Father     Social History Social History   Tobacco Use  . Smoking status: Current Every Day Smoker    Types: Cigars  . Smokeless tobacco: Never Used  Substance Use Topics  . Alcohol use: Yes    Comment: socially  . Drug use: No     Allergies   Patient has no known allergies.   Review of Systems Review of Systems  Gastrointestinal: Positive for vomiting.  All other systems reviewed and are negative.    Physical Exam Triage Vital Signs ED Triage Vitals [01/05/19 1815]  Enc Vitals Group     BP 135/76     Pulse Rate 81     Resp 17     Temp 98.4 F (36.9 C)     Temp Source Oral     SpO2 99 %     Weight      Height      Head Circumference      Peak Flow      Pain Score 0     Pain Loc      Pain Edu?       Excl. in GC?    No data found.  Updated Vital Signs BP 135/76 (BP Location: Left Arm)   Pulse 81   Temp 98.4 F (36.9 C) (Oral)   Resp 17   SpO2 99%   Visual Acuity Right Eye Distance:   Left Eye Distance:   Bilateral Distance:    Right Eye Near:   Left Eye Near:    Bilateral Near:     Physical Exam Vitals signs and nursing note reviewed.  Constitutional:      Appearance: He is well-developed.  HENT:     Head: Normocephalic and atraumatic.     Nose: Nose normal.  Eyes:     Conjunctiva/sclera: Conjunctivae normal.  Neck:     Musculoskeletal: Neck supple.  Cardiovascular:     Rate and Rhythm: Normal rate and regular rhythm.     Heart sounds: No murmur.  Pulmonary:     Effort: Pulmonary effort is normal. No respiratory distress.     Breath sounds: Normal breath sounds.  Abdominal:     Palpations: Abdomen is soft.  Tenderness: There is no abdominal tenderness.  Skin:    General: Skin is warm and dry.  Neurological:     General: No focal deficit present.     Mental Status: He is alert.  Psychiatric:        Mood and Affect: Mood normal.      UC Treatments / Results  Labs (all labs ordered are listed, but only abnormal results are displayed) Labs Reviewed - No data to display  EKG None  Radiology No results found.  Procedures Procedures (including critical care time)  Medications Ordered in UC Medications  ondansetron (ZOFRAN-ODT) disintegrating tablet 4 mg (has no administration in time range)    Initial Impression / Assessment and Plan / UC Course  I have reviewed the triage vital signs and the nursing notes.  Pertinent labs & imaging results that were available during my care of the patient were reviewed by me and considered in my medical decision making (see chart for details).     MDM   Pt given ODT zofran here.  Pt advised to follow up if symptoms persist past 2 days  Final Clinical Impressions(s) / UC Diagnoses   Final  diagnoses:  Nausea and vomiting, intractability of vomiting not specified, unspecified vomiting type   Discharge Instructions   None    ED Prescriptions    None     Controlled Substance Prescriptions [place] Controlled Substance Registry consulted? Not Applicable   Elson Areas, [place] 01/05/19 [id number]

## 2019-01-05 NOTE — ED Triage Notes (Signed)
Pt resents to [facility] for vomiting since yesterday. Pt denies any other symptoms.

## 2019-01-05 NOTE — Discharge Instructions (Addendum)
Return if any problems.

## 2020-03-14 ENCOUNTER — Ambulatory Visit
Admission: EM | Admit: 2020-03-14 | Discharge: 2020-03-14 | Disposition: A | Discharge status: Home / Self Care | Payer: 59 | Attending: Physician Assistant | Admitting: Physician Assistant

## 2020-03-14 DIAGNOSIS — R197 Diarrhea, unspecified: Secondary | ICD-10-CM | POA: Diagnosis not present

## 2020-03-14 DIAGNOSIS — R112 Nausea with vomiting, unspecified: Secondary | ICD-10-CM

## 2020-03-14 MED ORDER — ONDANSETRON 4 MG PO TBDP: 4.0000 mg | ORAL_TABLET | Freq: Three times a day (TID) | ORAL | 0 refills | Status: DC | PRN

## 2020-03-14 MED ORDER — DICYCLOMINE HCL 20 MG PO TABS: 20.0000 mg | ORAL_TABLET | Freq: Two times a day (BID) | ORAL | 0 refills | Status: DC

## 2020-03-14 NOTE — ED Triage Notes (Signed)
Pt states developed lower abdominal cramping yesterday. States worked last night with no problem. Got up today ate a few hours before going to work and vomit x1 after going to work at 4p, and again at 5:30.

## 2020-03-14 NOTE — ED Provider Notes (Signed)
[facility]    CSN: [id number] Arrival date & time: 03/14/20  1810      History   Chief Complaint Chief Complaint  Patient presents with  . Emesis    HPI Ethan Beck is a 44 y.o. male.   44 year old male comes in for 2 day history of abdominal cramping, nausea, vomiting. Has had few episodes of diarrhea as well. 3 episodes of NBNB vomit, which relieves abdominal pain slightly. Denies fever, URI symptoms. No sick contact.      History reviewed. No pertinent past medical history.  Patient Active Problem List   Diagnosis Date Noted  . Right knee pain 05/07/2017    History reviewed. No pertinent surgical history.     Home Medications    Prior to Admission medications   Medication Sig Start Date End Date Taking? Authorizing Provider  dicyclomine (BENTYL) 20 MG tablet Take 1 tablet (20 mg total) by mouth 2 (two) times daily. 03/14/20   Tasia Catchings, Seleta Hovland V, PA-C  ondansetron (ZOFRAN ODT) 4 MG disintegrating tablet Take 1 tablet (4 mg total) by mouth every 8 (eight) hours as needed for nausea or vomiting. 03/14/20   Ok Edwards, PA-C    Family History Family History  Problem Relation Age of Onset  . Diabetes Father     Social History Social History   Tobacco Use  . Smoking status: Current Every Day Smoker    Types: Cigars  . Smokeless tobacco: Never Used  Substance Use Topics  . Alcohol use: Not Currently    Comment: socially  . Drug use: No     Allergies   Patient has no known allergies.   Review of Systems Review of Systems  Reason unable to perform ROS: See HPI as above.     Physical Exam Triage Vital Signs ED Triage Vitals  Enc Vitals Group     BP 03/14/20 1834 118/82     Pulse Rate 03/14/20 1834 80     Resp 03/14/20 1834 18     Temp 03/14/20 1834 98.5 F (36.9 C)     Temp Source 03/14/20 1834 Oral     SpO2 03/14/20 1834 96 %     Weight --      Height --      Head Circumference --      Peak Flow --      Pain Score 03/14/20 1843 2       Pain Loc --      Pain Edu? --      Excl. in [place]? --    No data found.  Updated Vital Signs BP 118/82 (BP Location: Left Arm)   Pulse 80   Temp 98.5 F (36.9 C) (Oral)   Resp 18   SpO2 96%   Physical Exam Constitutional:      General: He is not in acute distress.    Appearance: Normal appearance. He is not ill-appearing, toxic-appearing or diaphoretic.  HENT:     Head: Normocephalic and atraumatic.  Cardiovascular:     Rate and Rhythm: Normal rate and regular rhythm.  Pulmonary:     Effort: Pulmonary effort is normal. No respiratory distress.     Comments: LCTAB Abdominal:     General: Bowel sounds are normal.     Palpations: Abdomen is soft.     Tenderness: There is no abdominal tenderness. There is no right CVA tenderness, left CVA tenderness, guarding or rebound.  Musculoskeletal:     Cervical back: Normal range of  motion and neck supple.  Skin:    General: Skin is warm and dry.  Neurological:     Mental Status: He is alert and oriented to person, place, and time.      UC Treatments / Results  Labs (all labs ordered are listed, but only abnormal results are displayed) Labs Reviewed - No data to display  EKG   Radiology No results found.  Procedures Procedures (including critical care time)  Medications Ordered in UC Medications - No data to display  Initial Impression / Assessment and Plan / UC Course  I have reviewed the triage vital signs and the nursing notes.  Pertinent labs & imaging results that were available during my care of the patient were reviewed by me and considered in my medical decision making (see chart for details).    Discussed with patient no alarming signs on exam. Zofran for nausea. Push fluids. Bland diet, advance as tolerated. Return precautions given.  Final Clinical Impressions(s) / UC Diagnoses   Final diagnoses:  Nausea vomiting and diarrhea    ED Prescriptions    Medication Sig Dispense Auth. Provider    ondansetron (ZOFRAN ODT) 4 MG disintegrating tablet Take 1 tablet (4 mg total) by mouth every 8 (eight) hours as needed for nausea or vomiting. 15 tablet Shalina Norfolk V, PA-C   dicyclomine (BENTYL) 20 MG tablet Take 1 tablet (20 mg total) by mouth 2 (two) times daily. 20 tablet Belinda Fisher, PA-C     PDMP not reviewed this encounter.   Belinda Fisher, PA-C 03/14/20 1900

## 2020-03-14 NOTE — Discharge Instructions (Signed)
Zofran for nausea and vomiting as needed. Bentyl for abdominal cramping if needed. Keep hydrated, you urine should be clear to pale yellow in color. Bland diet, advance as tolerated. Monitor for any worsening of symptoms, nausea or vomiting not controlled by medication, worsening abdominal pain, fever, go to the emergency department for further evaluation needed.

## 2020-09-19 ENCOUNTER — Ambulatory Visit
Admission: EM | Admit: 2020-09-19 | Discharge: 2020-09-19 | Disposition: A | Discharge status: Home / Self Care | Payer: 59 | Attending: Family Medicine | Admitting: Family Medicine

## 2020-09-19 ENCOUNTER — Other Ambulatory Visit: Payer: Self-pay

## 2020-09-19 ENCOUNTER — Encounter: Payer: Self-pay | Admitting: Emergency Medicine

## 2020-09-19 DIAGNOSIS — R112 Nausea with vomiting, unspecified: Secondary | ICD-10-CM | POA: Diagnosis not present

## 2020-09-19 MED ORDER — ONDANSETRON 4 MG PO TBDP: 4.0000 mg | ORAL_TABLET | Freq: Three times a day (TID) | ORAL | 0 refills | Status: DC | PRN

## 2020-09-19 NOTE — ED Triage Notes (Signed)
Pt sts nausea with 2 episodes of vomiting today and diarrhea x 1; pt has since held down crackers and ginger ale

## 2020-09-19 NOTE — ED Provider Notes (Signed)
[facility]    CSN: [id number] Arrival date & time: 09/19/20  1547      History   Chief Complaint Chief Complaint  Patient presents with  . Nausea    HPI Ethan Beck is a 44 y.o. male.   Established urgent care patient  Pt sts nausea with 2 episodes of vomiting today and diarrhea x 1; pt has since held down crackers and ginger ale.  2 loose stools yesterday     History reviewed. No pertinent past medical history.  Patient Active Problem List   Diagnosis Date Noted  . Right knee pain 05/07/2017    History reviewed. No pertinent surgical history.     Home Medications    Prior to Admission medications   Medication Sig Start Date End Date Taking? Authorizing Provider  ondansetron (ZOFRAN ODT) 4 MG disintegrating tablet Take 1 tablet (4 mg total) by mouth every 8 (eight) hours as needed for nausea or vomiting. 09/19/20   Elvina Sidle, MD  dicyclomine (BENTYL) 20 MG tablet Take 1 tablet (20 mg total) by mouth 2 (two) times daily. Patient not taking: Reported on 09/19/2020 03/14/20 09/19/20  Lurline Idol    Family History Family History  Problem Relation Age of Onset  . Diabetes Father     Social History Social History   Tobacco Use  . Smoking status: Current Every Day Smoker    Types: Cigars  . Smokeless tobacco: Never Used  Substance Use Topics  . Alcohol use: Not Currently    Comment: socially  . Drug use: No     Allergies   Patient has no known allergies.   Review of Systems Review of Systems  Gastrointestinal: Positive for nausea and vomiting. Negative for diarrhea.     Physical Exam Triage Vital Signs ED Triage Vitals  Enc Vitals Group     BP 09/19/20 1620 107/74     Pulse Rate 09/19/20 1620 70     Resp 09/19/20 1620 20     Temp 09/19/20 1620 98 F (36.7 C)     Temp Source 09/19/20 1620 Oral     SpO2 09/19/20 1620 96 %     Weight --      Height --      Head Circumference --      Peak Flow --      Pain  Score 09/19/20 1637 0     Pain Loc --      Pain Edu? --      Excl. in GC? --    No data found.  Updated Vital Signs BP 107/74 (BP Location: Left Arm)   Pulse 70   Temp 98 F (36.7 C) (Oral)   Resp 20   SpO2 96%    Physical Exam Vitals and nursing note reviewed.  Constitutional:      General: He is not in acute distress.    Appearance: Normal appearance. He is obese. He is not ill-appearing.  HENT:     Mouth/Throat:     Mouth: Mucous membranes are moist.     Pharynx: Oropharynx is clear.  Eyes:     General: Scleral icterus present.  Cardiovascular:     Rate and Rhythm: Normal rate.  Pulmonary:     Effort: Pulmonary effort is normal.  Abdominal:     General: Bowel sounds are normal.     Palpations: There is no mass.     Tenderness: There is no abdominal tenderness.  Musculoskeletal:  General: Normal range of motion.     Cervical back: Normal range of motion and neck supple.  Skin:    General: Skin is warm and dry.  Neurological:     General: No focal deficit present.     Mental Status: He is alert and oriented to person, place, and time.  Psychiatric:        Mood and Affect: Mood normal.        Behavior: Behavior normal.      UC Treatments / Results  Labs (all labs ordered are listed, but only abnormal results are displayed) Labs Reviewed - No data to display  EKG   Radiology No results found.  Procedures Procedures (including critical care time)  Medications Ordered in UC Medications - No data to display  Initial Impression / Assessment and Plan / UC Course  I have reviewed the triage vital signs and the nursing notes.  Pertinent labs & imaging results that were available during my care of the patient were reviewed by me and considered in my medical decision making (see chart for details).    Final Clinical Impressions(s) / UC Diagnoses   Final diagnoses:  Nausea and vomiting, intractability of vomiting not specified, unspecified  vomiting type   Discharge Instructions   None    ED Prescriptions    Medication Sig Dispense Auth. Provider   ondansetron (ZOFRAN ODT) 4 MG disintegrating tablet Take 1 tablet (4 mg total) by mouth every 8 (eight) hours as needed for nausea or vomiting. 15 tablet Elvina Sidle, MD     I have reviewed the PDMP during this encounter.   Elvina Sidle, MD 09/19/20 [phone]

## 2021-01-03 ENCOUNTER — Ambulatory Visit
Admission: EM | Admit: 2021-01-03 | Discharge: 2021-01-03 | Disposition: A | Discharge status: Home / Self Care | Payer: 59 | Attending: Emergency Medicine | Admitting: Emergency Medicine

## 2021-01-03 ENCOUNTER — Encounter: Payer: Self-pay | Admitting: Emergency Medicine

## 2021-01-03 ENCOUNTER — Other Ambulatory Visit: Payer: Self-pay

## 2021-01-03 DIAGNOSIS — K2900 Acute gastritis without bleeding: Secondary | ICD-10-CM

## 2021-01-03 MED ORDER — ONDANSETRON 4 MG PO TBDP: 4.0000 mg | ORAL_TABLET | Freq: Three times a day (TID) | ORAL | 0 refills | Status: DC | PRN

## 2021-01-03 MED ORDER — ALUM & MAG HYDROXIDE-SIMETH 200-200-20 MG/5ML PO SUSP
30.0000 mL | Freq: Once | ORAL | Status: AC
  Administered 2021-01-03: 30 mL via ORAL

## 2021-01-03 MED ORDER — SUCRALFATE 1 G PO TABS: 1.0000 g | ORAL_TABLET | Freq: Three times a day (TID) | ORAL | 0 refills | Status: DC

## 2021-01-03 MED ORDER — FAMOTIDINE 20 MG PO TABS: 20.0000 mg | ORAL_TABLET | Freq: Two times a day (BID) | ORAL | 0 refills | Status: DC

## 2021-01-03 MED ORDER — OMEPRAZOLE 20 MG PO CPDR: 20.0000 mg | DELAYED_RELEASE_CAPSULE | Freq: Two times a day (BID) | ORAL | 0 refills | Status: DC

## 2021-01-03 MED ORDER — ONDANSETRON 4 MG PO TBDP
4.0000 mg | ORAL_TABLET | Freq: Once | ORAL | Status: AC
  Administered 2021-01-03: 4 mg via ORAL

## 2021-01-03 MED ORDER — LIDOCAINE VISCOUS HCL 2 % MT SOLN
15.0000 mL | Freq: Once | OROMUCOSAL | Status: AC
  Administered 2021-01-03: 15 mL via ORAL

## 2021-01-03 NOTE — ED Triage Notes (Signed)
Patient c/o ABD pain and emesis x 3 days.   Patient states " every time I try to eat I throw up and I have some blood tinged throw up at times".   Patient endorses pain in the upper mid region of ABD.   Patient endorses attempting to drink ginger ale and eat crackers with no relief of symptoms.   Patient denies a history of previous symptoms.   Patient denies any ETOH consumption.

## 2021-01-03 NOTE — Discharge Instructions (Addendum)
Please use Zofran as needed for nausea/vomiting Begin omeprazole/Prilosec twice daily before meals for the next 2 weeks for prevention of underlying acid/gastritis Use Pepcid twice daily as needed for indigestion/abdominal discomfort Carafate before meals and bedtime to help with abdominal discomfort and tolerability of eating and drinking Please follow-up if not improving or worsening

## 2021-01-03 NOTE — ED Provider Notes (Signed)
[facility]    CSN: [id number] Arrival date & time: 01/03/21  0841      History   Chief Complaint Chief Complaint  Patient presents with  . Abdominal Pain  . Emesis    HPI Ethan Beck is a 45 y.o. male presenting today for evaluation of abdominal pain. Reports 3 days ago developed nausea and vomiting, vomit was blood tinged. Occasionally tolerating ginger ale/crackers. Reports dull pain in middle of stomach. Nausea only when eating and drinking. Denies history of Gi problems. Bowels at baseline. LBM Sunday, 2 days ago. Denies chest pain or shortness of breath. Denies fevers, fatigue. Reports cigar smoking daily, denies cigarette use. Denies alcohol use.   HPI  History reviewed. No pertinent past medical history.  Patient Active Problem List   Diagnosis Date Noted  . Right knee pain 05/07/2017    History reviewed. No pertinent surgical history.     Home Medications    Prior to Admission medications   Medication Sig Start Date End Date Taking? Authorizing Provider  famotidine (PEPCID) 20 MG tablet Take 1 tablet (20 mg total) by mouth 2 (two) times daily. 01/03/21  Yes Arnette Driggs C, PA-C  omeprazole (PRILOSEC) 20 MG capsule Take 1 capsule (20 mg total) by mouth 2 (two) times daily before a meal. 01/03/21  Yes Yamina Lenis C, PA-C  ondansetron (ZOFRAN ODT) 4 MG disintegrating tablet Take 1-2 tablets (4-8 mg total) by mouth every 8 (eight) hours as needed for nausea or vomiting. 01/03/21  Yes Cache Bills C, PA-C  sucralfate (CARAFATE) 1 g tablet Take 1 tablet (1 g total) by mouth 4 (four) times daily -  with meals and at bedtime. 01/03/21  Yes Barlow Harrison C, PA-C  dicyclomine (BENTYL) 20 MG tablet Take 1 tablet (20 mg total) by mouth 2 (two) times daily. Patient not taking: Reported on 09/19/2020 03/14/20 09/19/20  Lurline Idol    Family History Family History  Problem Relation Age of Onset  . Diabetes Father     Social History Social  History   Tobacco Use  . Smoking status: Current Some Day Smoker    Types: Cigars  . Smokeless tobacco: Never Used  Substance Use Topics  . Alcohol use: Not Currently    Comment: socially  . Drug use: No     Allergies   Patient has no known allergies.   Review of Systems Review of Systems  Constitutional: Negative for activity change, appetite change, chills, fatigue and fever.  HENT: Negative for congestion, ear pain, rhinorrhea, sinus pressure, sore throat and trouble swallowing.   Eyes: Negative for discharge and redness.  Respiratory: Negative for cough, chest tightness and shortness of breath.   Cardiovascular: Negative for chest pain.  Gastrointestinal: Positive for abdominal pain, nausea and vomiting. Negative for diarrhea.  Musculoskeletal: Negative for myalgias.  Skin: Negative for rash.  Neurological: Negative for dizziness, light-headedness and headaches.     Physical Exam Triage Vital Signs ED Triage Vitals  Enc Vitals Group     BP      Pulse      Resp      Temp      Temp src      SpO2      Weight      Height      Head Circumference      Peak Flow      Pain Score      Pain Loc      Pain Edu?  Excl. in GC?    No data found.  Updated Vital Signs BP 126/80 (BP Location: Left Arm)   Pulse 80   Temp 99.1 F (37.3 C) (Oral)   Resp 14   Ht 6' (1.829 m)   Wt 300 lb (136.1 kg)   SpO2 96%   BMI 40.69 kg/m   Visual Acuity Right Eye Distance:   Left Eye Distance:   Bilateral Distance:    Right Eye Near:   Left Eye Near:    Bilateral Near:     Physical Exam Vitals and nursing note reviewed.  Constitutional:      Appearance: He is well-developed.     Comments: No acute distress  HENT:     Head: Normocephalic and atraumatic.     Nose: Nose normal.  Eyes:     Conjunctiva/sclera: Conjunctivae normal.  Cardiovascular:     Rate and Rhythm: Normal rate and regular rhythm.  Pulmonary:     Effort: Pulmonary effort is normal. No  respiratory distress.     Comments: Breathing comfortably at rest, CTABL, no wheezing, rales or other adventitious sounds auscultated  Abdominal:     General: There is no distension.     Tenderness: There is abdominal tenderness.     Comments: Abdomen soft, nondistended, tender to palpation to epigastrium and left upper quadrant, negative rebound, negative Rovsing, negative McBurney's, negative Murphy's  Musculoskeletal:        General: Normal range of motion.     Cervical back: Neck supple.  Skin:    General: Skin is warm and dry.  Neurological:     Mental Status: He is alert and oriented to person, place, and time.      UC Treatments / Results  Labs (all labs ordered are listed, but only abnormal results are displayed) Labs Reviewed - No data to display  EKG   Radiology No results found.  Procedures Procedures (including critical care time)  Medications Ordered in UC Medications  alum & mag hydroxide-simeth (MAALOX/MYLANTA) 200-200-20 MG/5ML suspension 30 mL (30 mLs Oral Given 01/03/21 0913)    And  lidocaine (XYLOCAINE) 2 % viscous mouth solution 15 mL (15 mLs Oral Given 01/03/21 0913)  ondansetron (ZOFRAN-ODT) disintegrating tablet 4 mg (4 mg Oral Given 01/03/21 0913)    Initial Impression / Assessment and Plan / UC Course  I have reviewed the triage vital signs and the nursing notes.  Pertinent labs & imaging results that were available during my care of the patient were reviewed by me and considered in my medical decision making (see chart for details).     GI cocktail provided with very mild improvement in discomfort, symptoms most suggestive of underlying GERD/gastritis, cannot rule out underlying ulcer.  Initiating on PPI and supplementing with Carafate and Pepcid, Zofran for nausea and vomiting and monitor for return to tolerating oral intake.  Advised to follow-up if symptoms persisting or worsening.  Patient to go to emergency room if still not tolerating oral  intake or developing worsening abdominal pain.  If symptoms persist may need referral to gastroenterology.  Discussed strict return precautions. Patient verbalized understanding and is agreeable with plan.  Final Clinical Impressions(s) / UC Diagnoses   Final diagnoses:  Acute gastritis, presence of bleeding unspecified, unspecified gastritis type     Discharge Instructions     Please use Zofran as needed for nausea/vomiting Begin omeprazole/Prilosec twice daily before meals for the next 2 weeks for prevention of underlying acid/gastritis Use Pepcid twice daily as needed for  indigestion/abdominal discomfort Carafate before meals and bedtime to help with abdominal discomfort and tolerability of eating and drinking Please follow-up if not improving or worsening     ED Prescriptions    Medication Sig Dispense Auth. Provider   ondansetron (ZOFRAN ODT) 4 MG disintegrating tablet Take 1-2 tablets (4-8 mg total) by mouth every 8 (eight) hours as needed for nausea or vomiting. 20 tablet Rasheema Truluck C, PA-C   omeprazole (PRILOSEC) 20 MG capsule Take 1 capsule (20 mg total) by mouth 2 (two) times daily before a meal. 30 capsule Chancellor Vanderloop C, PA-C   famotidine (PEPCID) 20 MG tablet Take 1 tablet (20 mg total) by mouth 2 (two) times daily. 30 tablet Betsy Rosello C, PA-C   sucralfate (CARAFATE) 1 g tablet Take 1 tablet (1 g total) by mouth 4 (four) times daily -  with meals and at bedtime. 42 tablet Demaurion Dicioccio, [place] C, PA-C     PDMP not reviewed this encounter.   Lew Dawes, [place] 01/03/21 [phone]

## 2021-08-27 ENCOUNTER — Other Ambulatory Visit: Payer: Self-pay

## 2021-08-27 ENCOUNTER — Emergency Department (HOSPITAL_COMMUNITY)
Admission: EM | Admit: 2021-08-27 | Discharge: 2021-08-27 | Disposition: A | Discharge status: Home / Self Care | Payer: 59 | Attending: Emergency Medicine | Admitting: Emergency Medicine

## 2021-08-27 ENCOUNTER — Encounter (HOSPITAL_COMMUNITY): Payer: Self-pay

## 2021-08-27 ENCOUNTER — Emergency Department (HOSPITAL_COMMUNITY): Payer: 59

## 2021-08-27 DIAGNOSIS — Y93F2 Activity, caregiving, lifting: Secondary | ICD-10-CM | POA: Diagnosis not present

## 2021-08-27 DIAGNOSIS — X500XXA Overexertion from strenuous movement or load, initial encounter: Secondary | ICD-10-CM | POA: Diagnosis not present

## 2021-08-27 DIAGNOSIS — Y9289 Other specified places as the place of occurrence of the external cause: Secondary | ICD-10-CM | POA: Diagnosis not present

## 2021-08-27 DIAGNOSIS — F17290 Nicotine dependence, other tobacco product, uncomplicated: Secondary | ICD-10-CM | POA: Diagnosis not present

## 2021-08-27 DIAGNOSIS — R112 Nausea with vomiting, unspecified: Secondary | ICD-10-CM | POA: Diagnosis not present

## 2021-08-27 DIAGNOSIS — M545 Low back pain, unspecified: Secondary | ICD-10-CM | POA: Insufficient documentation

## 2021-08-27 MED ORDER — DEXAMETHASONE SODIUM PHOSPHATE 10 MG/ML IJ SOLN
10.0000 mg | Freq: Once | INTRAMUSCULAR | Status: AC
  Administered 2021-08-27: 10 mg via INTRAMUSCULAR
  Filled 2021-08-27: qty 1

## 2021-08-27 MED ORDER — LIDOCAINE 5 % EX PTCH
2.0000 | MEDICATED_PATCH | CUTANEOUS | Status: DC
  Administered 2021-08-27: 2 via TRANSDERMAL
  Filled 2021-08-27: qty 2

## 2021-08-27 MED ORDER — ACETAMINOPHEN 500 MG PO TABS
1000.0000 mg | ORAL_TABLET | Freq: Once | ORAL | Status: AC
  Administered 2021-08-27: 1000 mg via ORAL
  Filled 2021-08-27: qty 2

## 2021-08-27 MED ORDER — METHOCARBAMOL 500 MG PO TABS: 500.0000 mg | ORAL_TABLET | Freq: Three times a day (TID) | ORAL | 0 refills | Status: DC | PRN

## 2021-08-27 MED ORDER — ONDANSETRON 4 MG PO TBDP
4.0000 mg | ORAL_TABLET | Freq: Once | ORAL | Status: AC
  Administered 2021-08-27: 4 mg via ORAL
  Filled 2021-08-27: qty 1

## 2021-08-27 MED ORDER — LIDOCAINE 5 % EX PTCH: 1.0000 | MEDICATED_PATCH | Freq: Every day | CUTANEOUS | 0 refills | Status: DC | PRN

## 2021-08-27 MED ORDER — NAPROXEN 500 MG PO TABS: 500.0000 mg | ORAL_TABLET | Freq: Two times a day (BID) | ORAL | 0 refills | Status: DC | PRN

## 2021-08-27 MED ORDER — ONDANSETRON 4 MG PO TBDP: 4.0000 mg | ORAL_TABLET | Freq: Three times a day (TID) | ORAL | 0 refills | Status: DC | PRN

## 2021-08-27 NOTE — Discharge Instructions (Addendum)
You were seen in the emergency department for back pain today.  At this time we suspect that your pain is related to a muscle strain/spasm.  Your xray did not show any fractures.   I have prescribed you an anti-inflammatory medication and a muscle relaxer.  - Naproxen is a nonsteroidal anti-inflammatory medication that will help with pain and swelling. Be sure to take this medication as prescribed with food, 1 pill every 12 hours,  It should be taken with food, as it can cause stomach upset, and more seriously, stomach bleeding. Do not take other nonsteroidal anti-inflammatory medications with this such as Advil, Motrin, Aleve, Mobic, Goodie Powder, or Motrin.    - Robaxin is the muscle relaxer I have prescribed, this is meant to help with muscle tightness. Be aware that this medication may make you drowsy therefore the first time you take this it should be at a time you are in an environment where you can rest. Do not drive or operate heavy machinery when taking this medication. Do not drink alcohol or take other sedating medications with this medicine such as narcotics or benzodiazepines.   - Lidoderm patch-apply 1 patch to area but significant pain once per day to help numb/to the area.  Movement discard patch within 12 hours of application.  -Zofran: Take every hours as needed for nausea and vomiting.  You make take Tylenol per over the counter dosing with these medications.   We have prescribed you new medication(s) today. Discuss the medications prescribed today with your pharmacist as they can have adverse effects and interactions with your other medicines including over the counter and prescribed medications. Seek medical evaluation if you start to experience new or abnormal symptoms after taking one of these medicines, seek care immediately if you start to experience difficulty breathing, feeling of your throat closing, facial swelling, or rash as these could be indications of a more serious  allergic reaction   The application of heat can help soothe the pain.  Try to make sure to get up and move around to avoid excess stiffness.  Please follow-up with primary care within 3 days.  Return to the emergency department for new or worsening symptoms including but not limited to new or worsening pain, numbness, weakness, inability to walk, loss of control of bowel or bladder, fever, or any other concerns

## 2021-08-27 NOTE — ED Triage Notes (Signed)
Patient from with complaint of back pain that began at 2300. Pt states he was lifting his mother when he felt a sharp pain in the left lumbar area of his back. Pt also endorses 3 episodes of emesis prior to arrival.

## 2021-08-27 NOTE — ED Provider Notes (Signed)
[facility]-EMERGENCY DEPT Provider Note   CSN: [id number] Arrival date & time: 08/27/21  0038     History Chief Complaint  Patient presents with   Back Pain    Ethan Beck is a 45 y.o. male without significant past medical history who presents to the emergency department with complaints of back pain that began shortly prior to arrival.  Patient states that he was helping to lift his mother into her wheelchair when she slipped some with subsequent onset of back pain.  Patient's pain is to the left lower back, it did radiate down his leg initially but this has resolved, now stays in the lower back, feels stiff and sharp, worse with certain movements and positions, no alleviating factors.  Tried ibuprofen prior to arrival without relief.  He has had nausea with a couple episodes of vomiting with the pain. Denies numbness, tingling, weakness, saddle anesthesia, incontinence to bowel/bladder, fever, chills, IV drug use, dysuria, or hx of cancer. Patient has not had prior back surgeries, patient has not had similar back pain in the past.  HPI     History reviewed. No pertinent past medical history.  Patient Active Problem List   Diagnosis Date Noted   Right knee pain 05/07/2017    History reviewed. No pertinent surgical history.     Family History  Problem Relation Age of Onset   Diabetes Father     Social History   Tobacco Use   Smoking status: Some Days    Types: Cigars   Smokeless tobacco: Never  Substance Use Topics   Alcohol use: Not Currently    Comment: socially   Drug use: No    Home Medications Prior to Admission medications   Medication Sig Start Date End Date Taking? Authorizing Provider  famotidine (PEPCID) 20 MG tablet Take 1 tablet (20 mg total) by mouth 2 (two) times daily. 01/03/21   Wieters, Hallie C, PA-C  omeprazole (PRILOSEC) 20 MG capsule Take 1 capsule (20 mg total) by mouth 2 (two) times daily before a meal. 01/03/21    Wieters, Hallie C, PA-C  ondansetron (ZOFRAN ODT) 4 MG disintegrating tablet Take 1-2 tablets (4-8 mg total) by mouth every 8 (eight) hours as needed for nausea or vomiting. 01/03/21   Wieters, Hallie C, PA-C  sucralfate (CARAFATE) 1 g tablet Take 1 tablet (1 g total) by mouth 4 (four) times daily -  with meals and at bedtime. 01/03/21   Wieters, Hallie C, PA-C  dicyclomine (BENTYL) 20 MG tablet Take 1 tablet (20 mg total) by mouth 2 (two) times daily. Patient not taking: Reported on 09/19/2020 03/14/20 09/19/20  Belinda Fisher, PA-C    Allergies    Patient has no known allergies.  Review of Systems   Review of Systems  Constitutional:  Negative for chills, fever and unexpected weight change.  Gastrointestinal:  Positive for nausea and vomiting. Negative for abdominal pain.  Genitourinary:  Negative for dysuria.  Musculoskeletal:  Positive for back pain.  Neurological:  Negative for weakness and numbness.       Negative for saddle anesthesia or bowel/bladder incontinence.   All other systems reviewed and are negative.  Physical Exam Updated Vital Signs BP (!) 147/92   Pulse 98   Temp 98.3 F (36.8 C) (Oral)   Resp 19   Ht 6' (1.829 m)   Wt (!) 142.9 kg   SpO2 99%   BMI 42.72 kg/m   Physical Exam Constitutional:      General:  He is not in acute distress.    Appearance: He is well-developed. He is not toxic-appearing.  HENT:     Head: Normocephalic and atraumatic.  Cardiovascular:     Rate and Rhythm: Normal rate.  Pulmonary:     Effort: Pulmonary effort is normal.  Abdominal:     General: There is no distension.     Palpations: Abdomen is soft.     Tenderness: There is no abdominal tenderness. There is no guarding or rebound.  Musculoskeletal:     Cervical back: Normal range of motion and neck supple. No spinous process tenderness or muscular tenderness.     Comments: No obvious deformity, appreciable swelling, erythema, ecchymosis, significant open wounds, or increased  warmth.  Extremities: Normal ROM. Nontender.  Back: No point/focal vertebral tenderness, no palpable step off or crepitus.  Left lumbar paraspinal muscle tenderness to palpation.  Skin:    General: Skin is warm and dry.     Findings: No rash.  Neurological:     Mental Status: He is alert.     Deep Tendon Reflexes:     Reflex Scores:      Patellar reflexes are 2+ on the right side and 2+ on the left side.    Comments: Sensation grossly intact to bilateral lower extremities. 5/5 symmetric strength with plantar/dorsiflexion bilaterally. Gait is intact without obvious foot drop.     ED Results / Procedures / Treatments   Labs (all labs ordered are listed, but only abnormal results are displayed) Labs Reviewed - No data to display  EKG None  Radiology DG Lumbar Spine Complete  Result Date: 08/27/2021 CLINICAL DATA:  Back pain EXAM: LUMBAR SPINE - COMPLETE 4+ VIEW COMPARISON:  None. FINDINGS: There is no evidence of lumbar spine fracture. Alignment is normal. Intervertebral disc spaces are maintained. IMPRESSION: Negative. Electronically Signed   By: Deatra Robinson M.D.   On: 08/27/2021 03:58    Procedures Procedures   Medications Ordered in ED Medications  lidocaine (LIDODERM) 5 % 2 patch (2 patches Transdermal Patch Applied 08/27/21 0347)  ondansetron (ZOFRAN-ODT) disintegrating tablet 4 mg (4 mg Oral Given 08/27/21 0345)  acetaminophen (TYLENOL) tablet 1,000 mg (1,000 mg Oral Given 08/27/21 0345)  dexamethasone (DECADRON) injection 10 mg (10 mg Intramuscular Given 08/27/21 0345)    ED Course  I have reviewed the triage vital signs and the nursing notes.  Pertinent labs & imaging results that were available during my care of the patient were reviewed by me and considered in my medical decision making (see chart for details).    MDM Rules/Calculators/A&P                           Patient presents to the ED with complaints of back pain.  Nontoxic, vitals w/ elevated BP- low  suspicion for HTN emergency.  Left lumbar paraspinal muscle tenderness noted- plan for supportive care in the ED, discussed option of muscle relaxants/narcotics however patient is driving therefore declined.   Additional history obtained:  Additional history obtained from chart review & nursing note review.   Imaging Studies ordered:  L Spine xray ordered by triage, I independently reviewed, formal radiology impression shows: Negative  ED Course:  Patient without focal neurologic deficits, he is ambulatory, doubt cord compression or cauda equina syndrome.  He is afebrile without history of IVDU therefore doubt epidural abscess.  No urinary symptoms to raise concern for UTI.  Abdomen is nontender without peritoneal signs.  Suspect  his vomiting may have been pain related, he is tolerating p.o. currently without difficulty.  Overall pain seems muscular in nature, symptomatically feeling improved in the ED and feels comfortable with plan to go home to take muscle relaxant there.  Will provide work note.  Orthopedic/PCP follow-up. I discussed results, treatment plan, need for follow-up, and return precautions with the patient. Provided opportunity for questions, patient confirmed understanding and is in agreement with plan.   Portions of this note were generated with Scientist, clinical (histocompatibility and immunogenetics). Dictation errors may occur despite best attempts at proofreading.  Final Clinical Impression(s) / ED Diagnoses Final diagnoses:  Acute left-sided low back pain without sciatica    Rx / DC Orders ED Discharge Orders          Ordered    naproxen (NAPROSYN) 500 MG tablet  2 times daily PRN        08/27/21 0524    methocarbamol (ROBAXIN) 500 MG tablet  Every 8 hours PRN        08/27/21 0524    lidocaine (LIDODERM) 5 %  Daily PRN        08/27/21 0524    ondansetron (ZOFRAN ODT) 4 MG disintegrating tablet  Every 8 hours PRN        08/27/21 0526             Cherly Anderson, PA-C 08/27/21  9622    Sabas Sous, MD 08/27/21 [phone]

## 2022-06-15 IMAGING — CR DG LUMBAR SPINE COMPLETE 4+V
5 series · 5 of 5 positions shown · non-contrast
Comparison: None.

CLINICAL DATA: Back pain

EXAM:
LUMBAR SPINE - COMPLETE 4+ VIEW

[t lumbar spine ap]
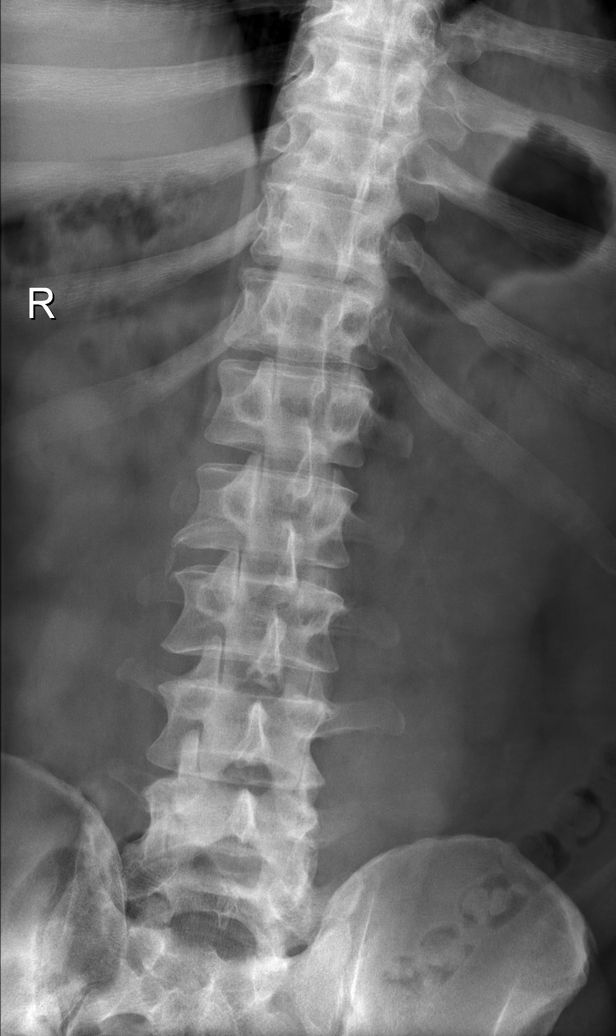

[t lumbar spine obl (1 of 2)]
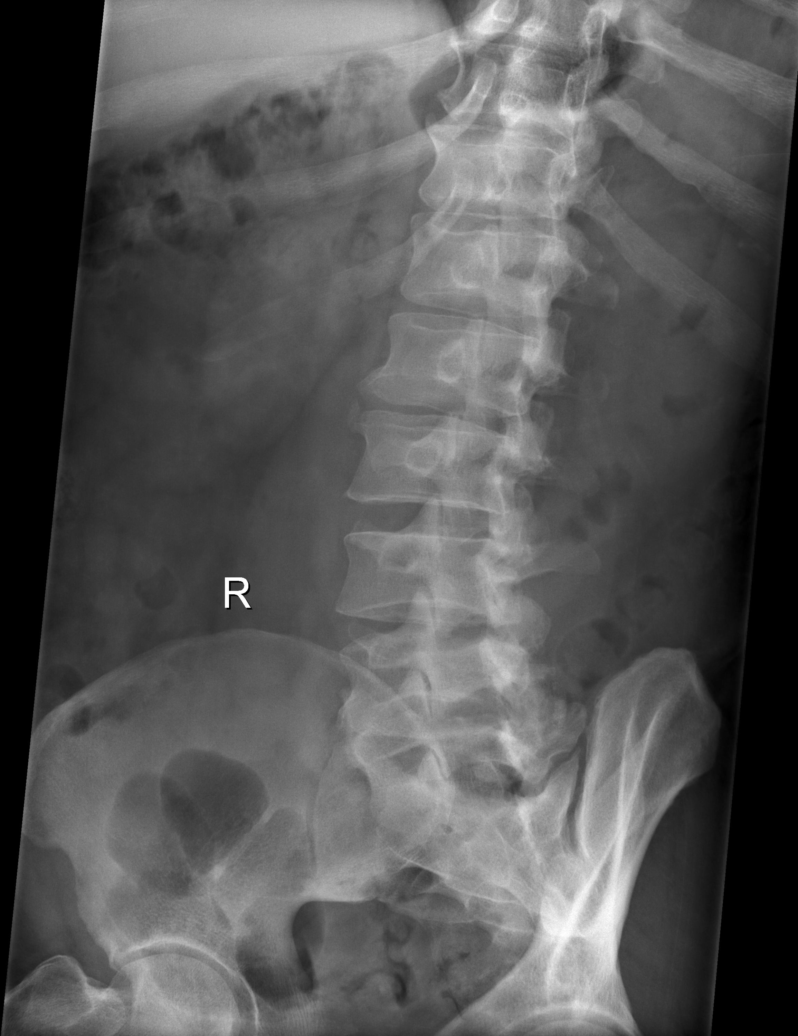

[t lumbar spine obl (2 of 2)]
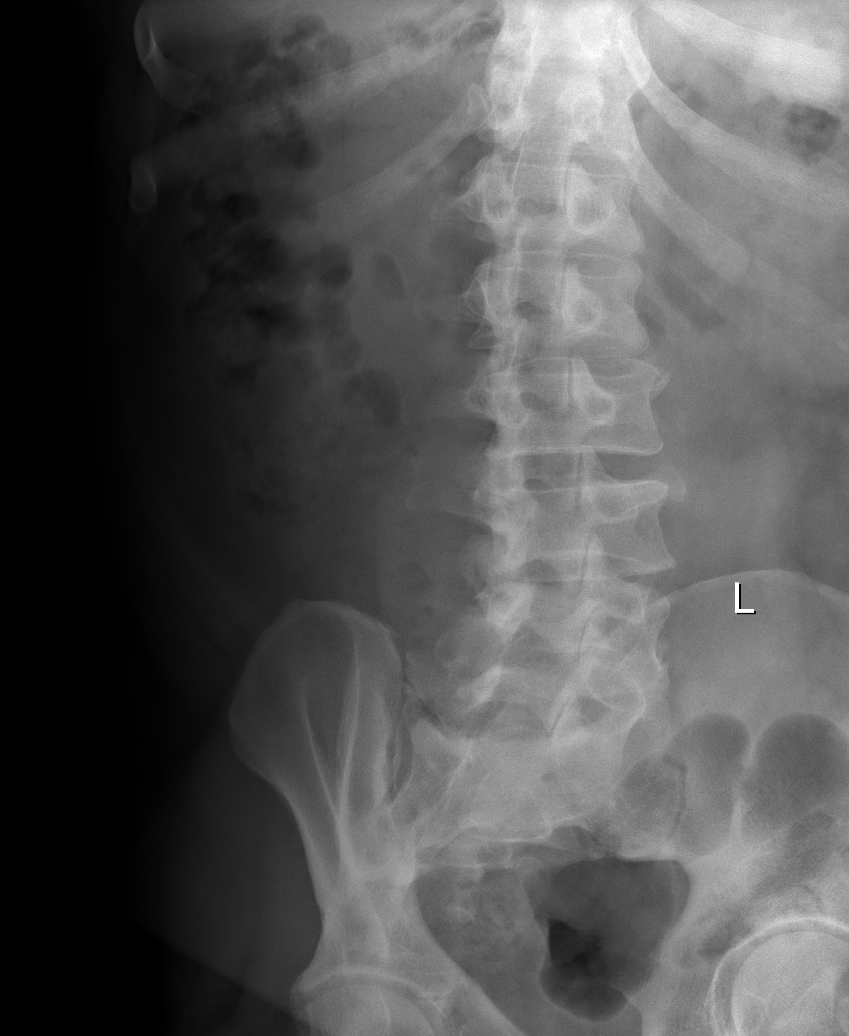

[t lumbar spine lat]
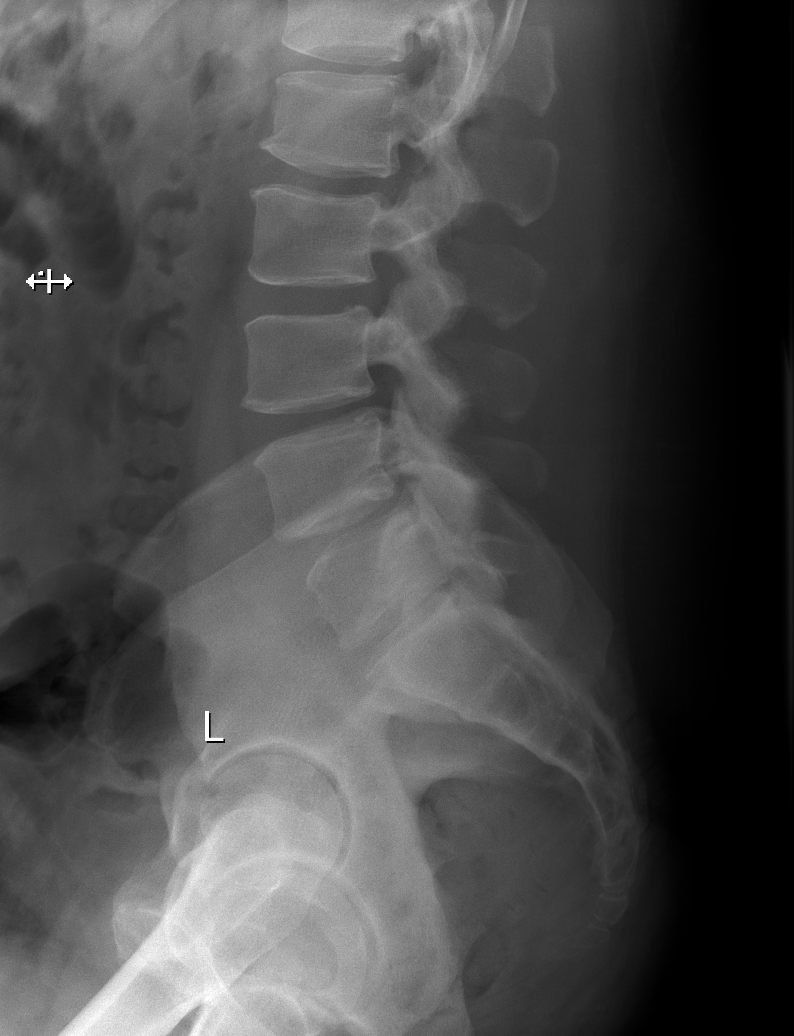

[t lumbar l-5 s-1 spot]
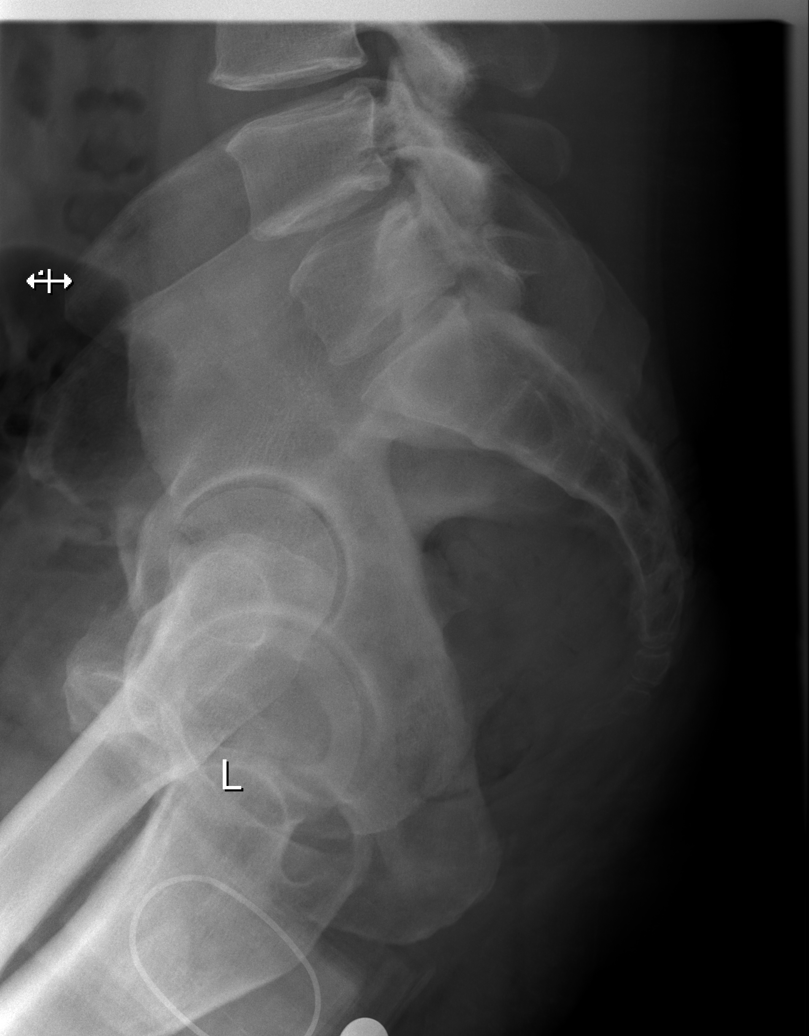

[5 of 5 positions shown; findings below may reference images not displayed]

FINDINGS: There is no evidence of lumbar spine fracture. Alignment is normal.
Intervertebral disc spaces are maintained.
IMPRESSION: Negative.

## 2022-09-20 ENCOUNTER — Ambulatory Visit
Admission: EM | Admit: 2022-09-20 | Discharge: 2022-09-20 | Disposition: A | Discharge status: Home / Self Care | Payer: 59 | Attending: Internal Medicine | Admitting: Internal Medicine

## 2022-09-20 DIAGNOSIS — K529 Noninfective gastroenteritis and colitis, unspecified: Secondary | ICD-10-CM

## 2022-09-20 MED ORDER — ONDANSETRON 4 MG PO TBDP: 4.0000 mg | ORAL_TABLET | Freq: Three times a day (TID) | ORAL | 0 refills | Status: DC | PRN

## 2022-09-20 MED ORDER — ONDANSETRON 4 MG PO TBDP
4.0000 mg | ORAL_TABLET | Freq: Once | ORAL | Status: AC
  Administered 2022-09-20: 4 mg via ORAL

## 2022-09-20 NOTE — ED Triage Notes (Signed)
Pt c/o nausea, headache, vomiting onset last night.

## 2022-09-20 NOTE — ED Provider Notes (Signed)
[facility]    CSN: [id number] Arrival date & time: 09/20/22  1246      History   Chief Complaint Chief Complaint  Patient presents with   Vomiting    HPI Ethan Beck is a 46 y.o. male.   Patient here today for evaluation of nausea, vomiting and headache that started last night.  He reports minimal diarrhea.  He states that he has had some abdominal pain but feels this was due to vomiting.  He has not had any blood in his stool or dark tarry stools.  He denies fever.  He has not taken any medication for symptoms.  The history is provided by the patient.    History reviewed. No pertinent past medical history.  Patient Active Problem List   Diagnosis Date Noted   Right knee pain 05/07/2017    History reviewed. No pertinent surgical history.     Home Medications    Prior to Admission medications   Medication Sig Start Date End Date Taking? Authorizing Provider  ondansetron (ZOFRAN-ODT) 4 MG disintegrating tablet Take 1 tablet (4 mg total) by mouth every 8 (eight) hours as needed. 09/20/22  Yes Tomi Bamberger, PA-C  dicyclomine (BENTYL) 20 MG tablet Take 1 tablet (20 mg total) by mouth 2 (two) times daily. Patient not taking: Reported on 09/19/2020 03/14/20 09/19/20  Lurline Idol    Family History Family History  Problem Relation Age of Onset   Diabetes Father     Social History Social History   Tobacco Use   Smoking status: Some Days    Types: Cigars   Smokeless tobacco: Never  Substance Use Topics   Alcohol use: Not Currently    Comment: socially   Drug use: No     Allergies   Patient has no known allergies.   Review of Systems Review of Systems  Constitutional:  Negative for chills and fever.  HENT:  Negative for congestion and sore throat.   Eyes:  Negative for discharge and redness.  Respiratory:  Negative for cough and shortness of breath.   Gastrointestinal:  Positive for abdominal pain, diarrhea, nausea and vomiting.   Neurological:  Positive for headaches. Negative for numbness.     Physical Exam Triage Vital Signs ED Triage Vitals [09/20/22 1457]  Enc Vitals Group     BP (!) 141/84     Pulse Rate 73     Resp 16     Temp 98 F (36.7 C)     Temp Source Oral     SpO2 97 %     Weight      Height      Head Circumference      Peak Flow      Pain Score 2     Pain Loc      Pain Edu?      Excl. in GC?    No data found.  Updated Vital Signs BP (!) 141/84 (BP Location: Left Arm)   Pulse 73   Temp 98 F (36.7 C) (Oral)   Resp 16   SpO2 97%      Physical Exam Vitals and nursing note reviewed.  Constitutional:      General: He is not in acute distress.    Appearance: Normal appearance. He is not ill-appearing.  HENT:     Head: Normocephalic and atraumatic.  Eyes:     Conjunctiva/sclera: Conjunctivae normal.  Cardiovascular:     Rate and Rhythm: Normal rate and regular rhythm.  Heart sounds: Normal heart sounds.  Pulmonary:     Effort: Pulmonary effort is normal. No respiratory distress.     Breath sounds: Normal breath sounds. No wheezing or rhonchi.  Abdominal:     General: Abdomen is flat. Bowel sounds are normal. There is no distension.     Palpations: Abdomen is soft.     Tenderness: There is no abdominal tenderness (mild TTP to mid to lower abdomen diffusely). There is no guarding or rebound.  Neurological:     Mental Status: He is alert.  Psychiatric:        Mood and Affect: Mood normal.        Behavior: Behavior normal.        Thought Content: Thought content normal.      UC Treatments / Results  Labs (all labs ordered are listed, but only abnormal results are displayed) Labs Reviewed - No data to display  EKG   Radiology No results found.  Procedures Procedures (including critical care time)  Medications Ordered in UC Medications  ondansetron (ZOFRAN-ODT) disintegrating tablet 4 mg (4 mg Oral Given 09/20/22 1459)    Initial Impression / Assessment  and Plan / UC Course  I have reviewed the triage vital signs and the nursing notes.  Pertinent labs & imaging results that were available during my care of the patient were reviewed by me and considered in my medical decision making (see chart for details).    Zofran prescribed for nausea.  Suspect likely viral etiology of symptoms and recommended symptomatic treatment and increase fluids with rest.  Encouraged follow-up if symptoms fail to improve or with any further concerns.  Patient expresses understanding.  Final Clinical Impressions(s) / UC Diagnoses   Final diagnoses:  Gastroenteritis   Discharge Instructions   None    ED Prescriptions     Medication Sig Dispense Auth. Provider   ondansetron (ZOFRAN-ODT) 4 MG disintegrating tablet Take 1 tablet (4 mg total) by mouth every 8 (eight) hours as needed. 20 tablet Tomi Bamberger, PA-C      PDMP not reviewed this encounter.   Tomi Bamberger, PA-C 09/20/22 1610

## 2022-09-24 ENCOUNTER — Ambulatory Visit (HOSPITAL_COMMUNITY)
Admission: EM | Admit: 2022-09-24 | Discharge: 2022-09-24 | Disposition: A | Discharge status: Home / Self Care | Payer: Managed Care, Other (non HMO) | Attending: Internal Medicine | Admitting: Internal Medicine

## 2022-09-24 ENCOUNTER — Encounter (HOSPITAL_COMMUNITY): Payer: Self-pay | Admitting: Emergency Medicine

## 2022-09-24 DIAGNOSIS — R0981 Nasal congestion: Secondary | ICD-10-CM

## 2022-09-24 DIAGNOSIS — A084 Viral intestinal infection, unspecified: Secondary | ICD-10-CM

## 2022-09-24 DIAGNOSIS — R110 Nausea: Secondary | ICD-10-CM

## 2022-09-24 MED ORDER — ONDANSETRON HCL 4 MG/2ML IJ SOLN
INTRAMUSCULAR | Status: AC
  Filled 2022-09-24: qty 2

## 2022-09-24 MED ORDER — ONDANSETRON HCL 4 MG/2ML IJ SOLN
4.0000 mg | Freq: Once | INTRAMUSCULAR | Status: AC
  Administered 2022-09-24: 4 mg via INTRAMUSCULAR

## 2022-09-24 NOTE — ED Provider Notes (Signed)
[facility]    CSN: [id number] Arrival date & time: 09/24/22  1138      History   Chief Complaint Chief Complaint  Patient presents with   Nausea    HPI Ethan Beck is a 46 y.o. male.   Patient presents to urgent care for evaluation of persistent nausea, vomiting, diarrhea, cough, and nasal congestion for the last 5 days.  Symptoms started on Wednesday, September 19, 2022.  Last episode of emesis was this morning.  He has been eating the brat diet and bland foods for the last several days while sick and states this has not been helping very much with the nausea and vomiting.  Attempted use of Pepto-Bismol as well which only made the nausea and vomiting worse.  No blood or mucus to the stools.  Denies heart palpitations, shortness of breath, chest pain, weakness, abdominal pain, urinary symptoms, back pai, body aches, chills, and neck pain.  Highest temp at home was 99.5.  He has been taking Tylenol intermittently as needed for aches and pains with relief.  Denies history of chronic medical problems.  He was seen on day 2 of symptoms where he was diagnosed with viral gastroenteritis and prescribed Zofran ODT.  He has been using this at home but states "it does not really work that well" and nausea is persistent.  He has also been using TheraFlu for nasal congestion and cough without very much relief of symptoms.  COVID-19 testing at home was negative.  He has been drinking Gatorade with electrolyte replacement and attempt to stay well-hydrated despite nausea, vomiting, and diarrhea.  No recent antibiotic or steroid use.     History reviewed. No pertinent past medical history.  Patient Active Problem List   Diagnosis Date Noted   Right knee pain 05/07/2017    History reviewed. No pertinent surgical history.     Home Medications    Prior to Admission medications   Medication Sig Start Date End Date Taking? Authorizing Provider  ondansetron (ZOFRAN-ODT) 4 MG  disintegrating tablet Take 1 tablet (4 mg total) by mouth every 8 (eight) hours as needed. 09/20/22   Tomi Bamberger, PA-C  dicyclomine (BENTYL) 20 MG tablet Take 1 tablet (20 mg total) by mouth 2 (two) times daily. Patient not taking: Reported on 09/19/2020 03/14/20 09/19/20  Lurline Idol    Family History Family History  Problem Relation Age of Onset   Diabetes Father     Social History Social History   Tobacco Use   Smoking status: Some Days    Types: Cigars   Smokeless tobacco: Never  Substance Use Topics   Alcohol use: Not Currently    Comment: socially   Drug use: No     Allergies   Patient has no known allergies.   Review of Systems Review of Systems Per HPI  Physical Exam Triage Vital Signs ED Triage Vitals  Enc Vitals Group     BP 09/24/22 1421 136/81     Pulse Rate 09/24/22 1421 72     Resp 09/24/22 1421 16     Temp 09/24/22 1421 98.2 F (36.8 C)     Temp Source 09/24/22 1421 Oral     SpO2 09/24/22 1421 98 %     Weight --      Height --      Head Circumference --      Peak Flow --      Pain Score 09/24/22 1423 7     Pain  Loc --      Pain Edu? --      Excl. in GC? --    No data found.  Updated Vital Signs BP 136/81 (BP Location: Left Arm)   Pulse 72   Temp 98.2 F (36.8 C) (Oral)   Resp 16   SpO2 98%   Visual Acuity Right Eye Distance:   Left Eye Distance:   Bilateral Distance:    Right Eye Near:   Left Eye Near:    Bilateral Near:     Physical Exam Vitals and nursing note reviewed.  Constitutional:      Appearance: He is ill-appearing. He is not toxic-appearing.  HENT:     Head: Normocephalic and atraumatic.     Right Ear: Hearing, tympanic membrane, ear canal and external ear normal.     Left Ear: Hearing, tympanic membrane, ear canal and external ear normal.     Nose: Congestion present.     Mouth/Throat:     Lips: Pink.     Mouth: Mucous membranes are moist.     Pharynx: No posterior oropharyngeal erythema.      Comments: Small amount of clear postnasal drainage visualized to the posterior oropharynx.  Eyes:     General: Lids are normal. Vision grossly intact. Gaze aligned appropriately.        Right eye: No discharge.        Left eye: No discharge.     Extraocular Movements: Extraocular movements intact.     Conjunctiva/sclera: Conjunctivae normal.  Cardiovascular:     Rate and Rhythm: Normal rate and regular rhythm.     Heart sounds: Normal heart sounds, S1 normal and S2 normal.  Pulmonary:     Effort: Pulmonary effort is normal. No respiratory distress.     Breath sounds: Normal breath sounds and air entry.  Abdominal:     General: Bowel sounds are normal.     Palpations: Abdomen is soft.     Tenderness: There is no abdominal tenderness. There is no right CVA tenderness, left CVA tenderness or guarding.     Comments: Abdominal exam is without peritoneal signs.  Musculoskeletal:     Cervical back: Neck supple.     Right lower leg: No edema.     Left lower leg: No edema.  Lymphadenopathy:     Cervical: No cervical adenopathy.  Skin:    General: Skin is warm and dry.     Capillary Refill: Capillary refill takes less than 2 seconds.     Findings: No rash.  Neurological:     General: No focal deficit present.     Mental Status: He is alert and oriented to person, place, and time. Mental status is at baseline.     Cranial Nerves: No dysarthria or facial asymmetry.  Psychiatric:        Mood and Affect: Mood normal.        Speech: Speech normal.        Behavior: Behavior normal.        Thought Content: Thought content normal.        Judgment: Judgment normal.      UC Treatments / Results  Labs (all labs ordered are listed, but only abnormal results are displayed) Labs Reviewed  CBC  BASIC METABOLIC PANEL    EKG   Radiology No results found.  Procedures Procedures (including critical care time)  Medications Ordered in UC Medications  ondansetron (ZOFRAN) injection 4 mg  (4 mg Intramuscular Given 09/24/22 1505)  Initial Impression / Assessment and Plan / UC Course  I have reviewed the triage vital signs and the nursing notes.  Pertinent labs & imaging results that were available during my care of the patient were reviewed by me and considered in my medical decision making (see chart for details).   1.  Viral gastroenteritis, nausea, nasal congestion Presentation is consistent with viral illness.  Patient given 4 mg Zofran IM in clinic as his last dose of Zofran was yesterday morning.  He has been able to tolerate fluids intermittently between nausea, vomiting, and diarrhea.  Heart rate is 72 bpm and he does not appear to be dehydrated in clinic.  He is tolerating oral liquids well at this time.  May continue rehydration with electrolyte replacement drinks at home as well as water.  May continue use of Zofran 4 mg ODT every 8 hours as needed for nausea and vomiting.  Brat diet to be continued.  May increase diet as tolerated.  May purchase Mucinex over-the-counter and take this twice daily to help with nasal congestion and cough.  CBC and BMP obtained to evaluate for electrolyte abnormality/hypokalemia related to persistent vomiting and diarrhea.  Strict ER return precautions discussed.  He is nontoxic in appearance with stable vital signs, therefore we will defer emergency department referral at this time.  He is agreeable with this plan.   Discussed physical exam and available lab work findings in clinic with patient.  Counseled patient regarding appropriate use of medications and potential side effects for all medications recommended or prescribed today. Discussed red flag signs and symptoms of worsening condition,when to call the PCP office, return to urgent care, and when to seek higher level of care in the emergency department. Patient verbalizes understanding and agreement with plan. All questions answered. Patient discharged in stable condition.    Final  Clinical Impressions(s) / UC Diagnoses   Final diagnoses:  Viral gastroenteritis  Nausea  Nasal congestion     Discharge Instructions      Your evaluation suggests that your symptoms are most likely due to viral illness (gastroenteritis) which will improve on its own with rest and fluids. Remember to drink plenty of fluids at home.   Continue taking Zofran 4 mg every 8 hours as needed for nausea and vomiting.  We gave you a dose here in the muscle so your next dose may be in 8 hours at 11 PM tonight if needed.  You may use Tylenol as needed for abdominal discomfort related to viral illness.   Eat a bland diet for the next 12-24 hours (bananas, rice, white toast, and applesauce). These foods are easy for your stomach to digest. Pedialyte can be purchased to help with rehydration. Drink plenty of water.   You may use Mucinex over-the-counter to help with nasal congestion and cough.  Please follow up with your primary care provider for further management. Return if you experience worsening or uncontrolled pain, inability to tolerate fluids by mouth, difficulty breathing, fevers 100.20F or greater, recurrent vomiting, or any other concerning symptoms.  I hope you feel better!       ED Prescriptions   None    PDMP not reviewed this encounter.   Carlisle Beers, [place] 09/24/22 [phone]

## 2022-09-24 NOTE — ED Triage Notes (Addendum)
Headache, N/V/D, x 1 week. Has been trying to do the BRAT diet without being able to keep down food or fluids. Taking alka-seltzer plus, then tried nyquil, no improvements. Denies fever, cough, nasal congestion, sore throat, ear pain, neck pain, visual changes, ocular pain. Headache bilateral, across the front of head, described as a throbbing pain.

## 2022-09-24 NOTE — Discharge Instructions (Signed)
Your evaluation suggests that your symptoms are most likely due to viral illness (gastroenteritis) which will improve on its own with rest and fluids. Remember to drink plenty of fluids at home.   Continue taking Zofran 4 mg every 8 hours as needed for nausea and vomiting.  We gave you a dose here in the muscle so your next dose may be in 8 hours at 11 PM tonight if needed.  You may use Tylenol as needed for abdominal discomfort related to viral illness.   Eat a bland diet for the next 12-24 hours (bananas, rice, white toast, and applesauce). These foods are easy for your stomach to digest. Pedialyte can be purchased to help with rehydration. Drink plenty of water.   You may use Mucinex over-the-counter to help with nasal congestion and cough.  Please follow up with your primary care provider for further management. Return if you experience worsening or uncontrolled pain, inability to tolerate fluids by mouth, difficulty breathing, fevers 100.15F or greater, recurrent vomiting, or any other concerning symptoms.  I hope you feel better!

## 2022-12-19 ENCOUNTER — Encounter (HOSPITAL_BASED_OUTPATIENT_CLINIC_OR_DEPARTMENT_OTHER): Payer: Self-pay

## 2022-12-19 ENCOUNTER — Emergency Department (HOSPITAL_BASED_OUTPATIENT_CLINIC_OR_DEPARTMENT_OTHER)
Admission: EM | Admit: 2022-12-19 | Discharge: 2022-12-19 | Disposition: A | Discharge status: Home / Self Care | Payer: Managed Care, Other (non HMO) | Attending: Emergency Medicine | Admitting: Emergency Medicine

## 2022-12-19 ENCOUNTER — Other Ambulatory Visit: Payer: Self-pay

## 2022-12-19 DIAGNOSIS — M545 Low back pain, unspecified: Secondary | ICD-10-CM | POA: Diagnosis present

## 2022-12-19 MED ORDER — SODIUM CHLORIDE 0.9 % IV BOLUS: 1000.0000 mL | Freq: Once | INTRAVENOUS | Status: DC

## 2022-12-19 MED ORDER — PROCHLORPERAZINE EDISYLATE 10 MG/2ML IJ SOLN: 10.0000 mg | Freq: Once | INTRAMUSCULAR | Status: DC

## 2022-12-19 MED ORDER — MORPHINE SULFATE (PF) 4 MG/ML IV SOLN: 4.0000 mg | Freq: Once | INTRAVENOUS | Status: DC

## 2022-12-19 MED ORDER — DIPHENHYDRAMINE HCL 50 MG/ML IJ SOLN: 25.0000 mg | Freq: Once | INTRAMUSCULAR | Status: DC

## 2022-12-19 MED ORDER — KETOROLAC TROMETHAMINE 15 MG/ML IJ SOLN
15.0000 mg | Freq: Once | INTRAMUSCULAR | Status: AC
  Administered 2022-12-19: 15 mg via INTRAMUSCULAR
  Filled 2022-12-19: qty 1

## 2022-12-19 MED ORDER — ACETAMINOPHEN 500 MG PO TABS
1000.0000 mg | ORAL_TABLET | Freq: Once | ORAL | Status: AC
  Administered 2022-12-19: 1000 mg via ORAL
  Filled 2022-12-19: qty 2

## 2022-12-19 NOTE — ED Notes (Signed)
Pt refuses medications at this time and requests to speak with EDP

## 2022-12-19 NOTE — ED Provider Notes (Addendum)
[facility] Provider Note   CSN: [id number] Arrival date & time: 12/19/22  1041     History  Chief Complaint  Patient presents with   Back Pain    Ethan Beck is a 47 y.o. male.  47 yo M with a chief complaint of left-sided low back pain.  Noticed this morning when he woke up.  Nothing seems to make it better or worse.  Denies any urinary symptoms.  Felt a little bit of nausea with it.  Denies radiation of the pain.  Denies trauma.  Denies loss of bowel or bladder denies loss of perirectal sensation that is numbness or weakness to the leg.   Back Pain      Home Medications Prior to Admission medications   Medication Sig Start Date End Date Taking? Authorizing Provider  ondansetron (ZOFRAN-ODT) 4 MG disintegrating tablet Take 1 tablet (4 mg total) by mouth every 8 (eight) hours as needed. 09/20/22   Francene Finders, PA-C  dicyclomine (BENTYL) 20 MG tablet Take 1 tablet (20 mg total) by mouth 2 (two) times daily. Patient not taking: Reported on 09/19/2020 03/14/20 09/19/20  Ok Edwards, PA-C      Allergies    Patient has no known allergies.    Review of Systems   Review of Systems  Musculoskeletal:  Positive for back pain.    Physical Exam Updated Vital Signs BP (!) 145/92   Pulse 97   Temp 97.9 F (36.6 C) (Oral)   Resp 17   Ht 6' (1.829 m)   Wt (!) 140.6 kg   SpO2 99%   BMI 42.04 kg/m  Physical Exam Vitals and nursing note reviewed.  Constitutional:      Appearance: He is well-developed.  HENT:     Head: Normocephalic and atraumatic.  Eyes:     Pupils: Pupils are equal, round, and reactive to light.  Neck:     Vascular: No JVD.  Cardiovascular:     Rate and Rhythm: Normal rate and regular rhythm.     Heart sounds: No murmur heard.    No friction rub. No gallop.  Pulmonary:     Effort: No respiratory distress.     Breath sounds: No wheezing.  Abdominal:     General: There is no distension.      Tenderness: There is no abdominal tenderness. There is no guarding or rebound.  Musculoskeletal:        General: Normal range of motion.     Cervical back: Normal range of motion and neck supple.     Comments: Patient grimaces upon going from a lying to a seated position and grimaces with rotating to turn to the side in the bed.  He has mild pain about the left sided musculature about the level of the L-spine he has no midline spinal tenderness step-offs or deformities.  Pulse motor and sensation intact to the left lower extremity.  Reflexes are 2+ and equal.  No clonus.  Ambulates without issue.  Skin:    Coloration: Skin is not pale.     Findings: No rash.  Neurological:     Mental Status: He is alert and oriented to person, place, and time.  Psychiatric:        Behavior: Behavior normal.     ED Results / Procedures / Treatments   Labs (all labs ordered are listed, but only abnormal results are displayed) Labs Reviewed - No data to display  EKG None  Radiology No results found.  Procedures Procedures    Medications Ordered in ED Medications  ketorolac (TORADOL) 15 MG/ML injection 15 mg (has no administration in time range)  acetaminophen (TYLENOL) tablet 1,000 mg (has no administration in time range)    ED Course/ Medical Decision Making/ A&P                             Medical Decision Making Risk OTC drugs. Prescription drug management.   65 yoM with a chief complaints of left-sided low back pain.  This was noticed this morning.  He denies anything that makes it better or worse but is definitely worse when he tries to sit up or turn in the bed.  I think this is most likely musculoskeletal by history and physical.  He has no red flags.  Will treat supportively.  PCP follow-up.  11:57 AM I was notfied by nursing that the patient had some questions about his evaluation in the ED.  He asked if I could obtain blood work and a screening plain film.  I discussed with him  about the limitations of such testing.  I discussed with him as a had earlier about how lab testing would be unlikely to be helpful based on his presentation.  I also discussed with him the unlikelihood that he has a broken bone in his back without any history of trauma.  The patient again did not understand this and would like it performed.  I told him that he could have it performed as an outpatient to be contacted his primary care physician.  Will discharge at this time.   11:43 AM:  I have discussed the diagnosis/risks/treatment options with the patient.  Evaluation and diagnostic testing in the emergency department does not suggest an emergent condition requiring admission or immediate intervention beyond what has been performed at this time.  They will follow up with PCP. We also discussed returning to the ED immediately if new or worsening sx occur. We discussed the sx which are most concerning (e.g., sudden worsening pain, fever, inability to tolerate by mouth, cauda equina s/sx) that necessitate immediate return. Medications administered to the patient during their visit and any new prescriptions provided to the patient are listed below.  Medications given during this visit Medications  ketorolac (TORADOL) 15 MG/ML injection 15 mg (has no administration in time range)  acetaminophen (TYLENOL) tablet 1,000 mg (has no administration in time range)         Final Clinical Impression(s) / ED Diagnoses Final diagnoses:  Acute left-sided low back pain without sciatica    Rx / DC Orders ED Discharge Orders     None            Deno Etienne, DO 12/19/22 1159

## 2022-12-19 NOTE — ED Notes (Signed)
Pt left AVS papers at bedside

## 2022-12-19 NOTE — Discharge Instructions (Signed)
Your back pain is most likely due to a muscular strain.  There is been a lot of research on back pain, unfortunately the only thing that seems to really help is Tylenol and ibuprofen.  Relative rest is also important to not lift greater than 10 pounds bending or twisting at the waist.  Please follow-up with your family physician.  The other thing that really seems to benefit patients is physical therapy which your doctor may send you for.  Please return to the emergency department for new numbness or weakness to your arms or legs. Difficulty with urinating or urinating or pooping on yourself.  Also if you cannot feel toilet paper when you wipe or get a fever.   Take 4 over the counter ibuprofen tablets 3 times a day or 2 over-the-counter naproxen tablets twice a day for pain. Also take tylenol '1000mg'$ (2 extra strength) four times a day.   Stretches also can help.  You can try these if you like [email or web address]

## 2022-12-19 NOTE — ED Notes (Addendum)
EDP spoke with pt. No change made in care. Medication administered as previously ordered. Pt did not want AVS reviewed, but did review medications for pain Tylenol/Ibuprofen.  Pt ambulatory at discharge

## 2022-12-19 NOTE — ED Triage Notes (Signed)
"  Woke up this morning with left lower back pain, constant" per pt  Denies dysuria, denies n/v/d or constipation

## 2023-05-17 ENCOUNTER — Emergency Department (HOSPITAL_BASED_OUTPATIENT_CLINIC_OR_DEPARTMENT_OTHER)
Admission: EM | Admit: 2023-05-17 | Discharge: 2023-05-18 | Disposition: A | Discharge status: Home / Self Care | Payer: Managed Care, Other (non HMO) | Attending: Emergency Medicine | Admitting: Emergency Medicine

## 2023-05-17 ENCOUNTER — Encounter (HOSPITAL_BASED_OUTPATIENT_CLINIC_OR_DEPARTMENT_OTHER): Payer: Self-pay

## 2023-05-17 ENCOUNTER — Other Ambulatory Visit: Payer: Self-pay

## 2023-05-17 ENCOUNTER — Emergency Department (HOSPITAL_BASED_OUTPATIENT_CLINIC_OR_DEPARTMENT_OTHER): Payer: Managed Care, Other (non HMO)

## 2023-05-17 ENCOUNTER — Ambulatory Visit
Admission: EM | Admit: 2023-05-17 | Discharge: 2023-05-17 | Disposition: A | Discharge status: Home / Self Care | Payer: Managed Care, Other (non HMO)

## 2023-05-17 DIAGNOSIS — R112 Nausea with vomiting, unspecified: Secondary | ICD-10-CM

## 2023-05-17 DIAGNOSIS — R519 Headache, unspecified: Secondary | ICD-10-CM | POA: Diagnosis not present

## 2023-05-17 LAB — CBC
HCT: 47.3 % (ref 39.0–52.0)
Hemoglobin: 16 g/dL (ref 13.0–17.0)
MCH: 32.5 pg (ref 26.0–34.0)
MCHC: 33.8 g/dL (ref 30.0–36.0)
MCV: 96.1 fL (ref 80.0–100.0)
Platelets: 207 10*3/uL (ref 150–400)
RBC: 4.92 MIL/uL (ref 4.22–5.81)
RDW: 12.2 % (ref 11.5–15.5)
WBC: 7 10*3/uL (ref 4.0–10.5)
nRBC: 0 % (ref 0.0–0.2)

## 2023-05-17 LAB — URINALYSIS, ROUTINE W REFLEX MICROSCOPIC
Bilirubin Urine: NEGATIVE
Glucose, UA: NEGATIVE mg/dL
Hgb urine dipstick: NEGATIVE
Ketones, ur: NEGATIVE mg/dL
Leukocytes,Ua: NEGATIVE
Nitrite: NEGATIVE
Protein, ur: NEGATIVE mg/dL
Specific Gravity, Urine: 1.02 (ref 1.005–1.030)
pH: 6.5 (ref 5.0–8.0)

## 2023-05-17 LAB — COMPREHENSIVE METABOLIC PANEL
ALT: 22 U/L (ref 0–44)
AST: 29 U/L (ref 15–41)
Albumin: 4.2 g/dL (ref 3.5–5.0)
Alkaline Phosphatase: 49 U/L (ref 38–126)
Anion gap: 8 (ref 5–15)
BUN: 10 mg/dL (ref 6–20)
CO2: 28 mmol/L (ref 22–32)
Calcium: 9.9 mg/dL (ref 8.9–10.3)
Chloride: 105 mmol/L (ref 98–111)
Creatinine, Ser: 0.95 mg/dL (ref 0.61–1.24)
GFR, Estimated: >60 mL/min (ref >60)
Glucose, Bld: 84 mg/dL (ref 70–99)
Potassium: 3.8 mmol/L (ref 3.5–5.1)
Sodium: 141 mmol/L (ref 135–145)
Total Bilirubin: 1 mg/dL (ref 0.3–1.2)
Total Protein: 6.7 g/dL (ref 6.5–8.1)

## 2023-05-17 LAB — LIPASE, BLOOD: Lipase: 11 U/L (ref 11–51)

## 2023-05-17 MED ORDER — ONDANSETRON 4 MG PO TBDP
8.0000 mg | ORAL_TABLET | Freq: Once | ORAL | Status: AC
  Administered 2023-05-17: 8 mg via ORAL
  Filled 2023-05-17: qty 2

## 2023-05-17 NOTE — ED Triage Notes (Signed)
POV from home, A&O x 4, GCS 15, amb to triage  Pt sts that this morning he woke up feeling fine, went to work started to have headache, then started to vomit. Cont to have 6/10 headache and nausea. Sent from UC.

## 2023-05-17 NOTE — Discharge Instructions (Addendum)
Go straight to the emergency department as soon as you leave urgent care for further evaluation and management. 

## 2023-05-17 NOTE — ED Notes (Signed)
Patient is being discharged from the Urgent Care and sent to the Emergency Department via private vehicle . Per Laren Everts NP, patient is in need of higher level of care due to headache and vomiting. Patient is aware and verbalizes understanding of plan of care.  Vitals:   05/17/23 1926 05/17/23 1927  BP:  110/74  Pulse: 61   Resp: 16   Temp: 98.1 F (36.7 C)   SpO2: 97%

## 2023-05-17 NOTE — ED Provider Notes (Signed)
[facility] AT [facility] Provider Note   CSN: [id number] Arrival date & time: 05/17/23  2038     History  Chief Complaint  Patient presents with   Emesis    Ethan Beck is a 47 y.o. male.  Patient is a 47 year old male with no significant past medical history.  Patient sent from urgent care for evaluation of headache.  He was at work today when he developed sudden onset of headache that he describes as pressure behind his eyes.  He reports becoming nauseated and having several episodes of vomiting.  No fevers or chills during these episodes.  He reports taking an Advil at work which did seem to help.  He was seen at urgent care and then sent here for imaging of his head.  He denies any weakness or numbness.  He denies any visual disturbances.  The history is provided by the patient.       Home Medications Prior to Admission medications   Medication Sig Start Date End Date Taking? Authorizing Provider  ondansetron (ZOFRAN-ODT) 4 MG disintegrating tablet Take 1 tablet (4 mg total) by mouth every 8 (eight) hours as needed. 09/20/22   Tomi Bamberger, PA-C  dicyclomine (BENTYL) 20 MG tablet Take 1 tablet (20 mg total) by mouth 2 (two) times daily. Patient not taking: Reported on 09/19/2020 03/14/20 09/19/20  Belinda Fisher, PA-C      Allergies    Patient has no known allergies.    Review of Systems   Review of Systems  All other systems reviewed and are negative.   Physical Exam Updated Vital Signs BP 128/72 (BP Location: Right Arm)   Pulse 74   Temp 97.8 F (36.6 C)   Resp 18   Ht 6' (1.829 m)   Wt 125.2 kg   SpO2 100%   BMI 37.43 kg/m  Physical Exam Vitals and nursing note reviewed.  Constitutional:      General: He is not in acute distress.    Appearance: Normal appearance. He is well-developed. He is not diaphoretic.  HENT:     Head: Normocephalic and atraumatic.  Eyes:     Extraocular Movements: Extraocular movements intact.      Pupils: Pupils are equal, round, and reactive to light.  Cardiovascular:     Rate and Rhythm: Normal rate and regular rhythm.     Heart sounds: No murmur heard.    No friction rub.  Pulmonary:     Effort: Pulmonary effort is normal. No respiratory distress.     Breath sounds: Normal breath sounds. No wheezing or rales.  Abdominal:     General: Bowel sounds are normal. There is no distension.     Palpations: Abdomen is soft.     Tenderness: There is no abdominal tenderness.  Musculoskeletal:        General: Normal range of motion.     Cervical back: Normal range of motion and neck supple.  Skin:    General: Skin is warm and dry.  Neurological:     General: No focal deficit present.     Mental Status: He is alert and oriented to person, place, and time.     Cranial Nerves: No cranial nerve deficit.     Motor: No weakness.     Coordination: Coordination normal.     ED Results / Procedures / Treatments   Labs (all labs ordered are listed, but only abnormal results are displayed) Labs Reviewed  LIPASE, BLOOD  COMPREHENSIVE METABOLIC  PANEL  CBC  URINALYSIS, ROUTINE W REFLEX MICROSCOPIC    EKG None  Radiology No results found.  Procedures Procedures    Medications Ordered in ED Medications  ondansetron (ZOFRAN-ODT) disintegrating tablet 8 mg (has no administration in time range)    ED Course/ Medical Decision Making/ A&P  Patient sent here from urgent care for imaging studies after experiencing sudden onset of a headache today while at work.  He also experienced several episodes of vomiting.  While waiting to be seen, his symptoms have significantly improved.  He arrives here with stable vital signs and physical examination which is unremarkable.  CBC, CMP, and lipase obtained, all of which were normal.  CT scan of the head obtained also was normal.  Patient given ODT Zofran here in the ER and seems to be feeling better.  At this point, I feel as though he can  safely be discharged.  Because of his headache unclear, but nothing appears emergent.  Patient to be discharged with ibuprofen and follow-up as needed.  Final Clinical Impression(s) / ED Diagnoses Final diagnoses:  None    Rx / DC Orders ED Discharge Orders     None         Geoffery Lyons, MD 05/18/23 0006

## 2023-05-17 NOTE — ED Provider Notes (Signed)
[facility]    CSN: [id number] Arrival date & time: 05/17/23  1841      History   Chief Complaint Chief Complaint  Patient presents with   Headache    HPI Ethan Beck is a 47 y.o. male.   Patient presents with sudden onset of severe headache to bilateral temporal area that started about 3:30 PM today.  Reports he also has some nausea and vomiting.  Denies dizziness, blurred vision.  Denies history of migraines.  Denies any recent falls or head injury.   Headache   History reviewed. No pertinent past medical history.  Patient Active Problem List   Diagnosis Date Noted   Right knee pain 05/07/2017    History reviewed. No pertinent surgical history.     Home Medications    Prior to Admission medications   Medication Sig Start Date End Date Taking? Authorizing Provider  ondansetron (ZOFRAN-ODT) 4 MG disintegrating tablet Take 1 tablet (4 mg total) by mouth every 8 (eight) hours as needed. 09/20/22   Tomi Bamberger, PA-C  dicyclomine (BENTYL) 20 MG tablet Take 1 tablet (20 mg total) by mouth 2 (two) times daily. Patient not taking: Reported on 09/19/2020 03/14/20 09/19/20  Lurline Idol    Family History Family History  Problem Relation Age of Onset   Diabetes Father     Social History Social History   Tobacco Use   Smoking status: Some Days    Types: Cigars   Smokeless tobacco: Never  Substance Use Topics   Alcohol use: Not Currently    Comment: socially   Drug use: No     Allergies   Patient has no known allergies.   Review of Systems Review of Systems Per HPI  Physical Exam Triage Vital Signs ED Triage Vitals  Encounter Vitals Group     BP 05/17/23 1927 110/74     Systolic BP Percentile --      Diastolic BP Percentile --      Pulse Rate 05/17/23 1926 61     Resp 05/17/23 1926 16     Temp 05/17/23 1926 98.1 F (36.7 C)     Temp Source 05/17/23 1926 Oral     SpO2 05/17/23 1926 97 %     Weight --      Height --       Head Circumference --      Peak Flow --      Pain Score 05/17/23 1927 9     Pain Loc --      Pain Education --      Exclude from Growth Chart --    No data found.  Updated Vital Signs BP 110/74 (BP Location: Left Arm)   Pulse 61   Temp 98.1 F (36.7 C) (Oral)   Resp 16   SpO2 97%   Visual Acuity Right Eye Distance:   Left Eye Distance:   Bilateral Distance:    Right Eye Near:   Left Eye Near:    Bilateral Near:     Physical Exam Constitutional:      General: He is not in acute distress.    Appearance: Normal appearance. He is not toxic-appearing or diaphoretic.  HENT:     Head: Normocephalic and atraumatic.  Eyes:     Extraocular Movements: Extraocular movements intact.     Conjunctiva/sclera: Conjunctivae normal.     Pupils: Pupils are equal, round, and reactive to light.  Pulmonary:     Effort: Pulmonary effort is normal.  Neurological:     General: No focal deficit present.     Mental Status: He is alert and oriented to person, place, and time. Mental status is at baseline.     Cranial Nerves: Cranial nerves 2-12 are intact.     Sensory: Sensation is intact.     Motor: Motor function is intact.     Coordination: Coordination is intact.     Gait: Gait is intact.  Psychiatric:        Mood and Affect: Mood normal.        Behavior: Behavior normal.        Thought Content: Thought content normal.        Judgment: Judgment normal.      UC Treatments / Results  Labs (all labs ordered are listed, but only abnormal results are displayed) Labs Reviewed - No data to display  EKG   Radiology No results found.  Procedures Procedures (including critical care time)  Medications Ordered in UC Medications - No data to display  Initial Impression / Assessment and Plan / UC Course  I have reviewed the triage vital signs and the nursing notes.  Pertinent labs & imaging results that were available during my care of the patient were reviewed by me and  considered in my medical decision making (see chart for details).     Patient had a sudden onset of severe headache with associated nausea and vomiting.  Patient's blood pressure is also a lot lower than his baseline which is concerning.  Therefore, patient was advised that he most likely needs imaging of the head which cannot be provided here in urgent care.  He was advised to go to the ER for further evaluation and management.  He was agreeable with this plan.  Given vital signs and neuroexam are stable, agree with patient self transport to the ER. Final Clinical Impressions(s) / UC Diagnoses   Final diagnoses:  Sudden onset of severe headache  Nausea and vomiting, unspecified vomiting type     Discharge Instructions      Go straight to the emergency department as soon as you leave urgent care for further evaluation and management.    ED Prescriptions   None    PDMP not reviewed this encounter.   Gustavus Bryant, [place] 05/17/23 2001

## 2023-05-17 NOTE — ED Triage Notes (Signed)
Pt states he was working today and had a sudden onset headache and then he started to vomit. States he took advil with no relief.

## 2023-05-18 NOTE — ED Notes (Signed)
 RN reviewed discharge instructions with pt. Pt verbalized understanding and had no further questions. VSS upon discharge.  

## 2023-05-18 NOTE — Discharge Instructions (Signed)
Take ibuprofen 600 mg every 6 hours as needed for pain.  Drink plenty of fluids and get plenty of rest.  Return to the ER if symptoms significantly worsen or change.

## 2023-06-07 ENCOUNTER — Encounter: Payer: Self-pay | Admitting: *Deleted

## 2023-06-07 ENCOUNTER — Ambulatory Visit
Admission: EM | Admit: 2023-06-07 | Discharge: 2023-06-07 | Disposition: A | Discharge status: Home / Self Care | Payer: Commercial Managed Care - PPO | Attending: Internal Medicine | Admitting: Internal Medicine

## 2023-06-07 ENCOUNTER — Other Ambulatory Visit: Payer: Self-pay

## 2023-06-07 DIAGNOSIS — A084 Viral intestinal infection, unspecified: Secondary | ICD-10-CM | POA: Diagnosis not present

## 2023-06-07 MED ORDER — ONDANSETRON 4 MG PO TBDP
4.0000 mg | ORAL_TABLET | Freq: Once | ORAL | Status: AC
  Administered 2023-06-07: 4 mg via ORAL

## 2023-06-07 NOTE — Discharge Instructions (Addendum)
 Your evaluation suggests that your symptoms are most likely due to viral stomach illness (gastroenteritis/"stomach bug") which will improve on its own with rest and fluids in the next few days.   Take zofran to help with nausea every 8 hours as needed. You may use over the counter medicines for aches and pains such as tylenol as needed.  Start sipping on liquids (broth, water, gatorade, etc). If you are able to keep liquids down without vomiting for 1-2 hours, you may eat bland foods like jello, pudding, applesauce, bananas, rice, and white toast. Once you can tolerate blands, you may return to normal diet.   Pedialyte or gatorolyte may help to prevent/fix dehydration due to vomiting and diarrhea.  Please follow up with your primary care provider for further management. Return if you experience worsening or uncontrolled pain, inability to tolerate fluids by mouth, difficulty breathing, fevers 100.83F or greater, recurrent vomiting, or any other concerning symptoms.

## 2023-06-07 NOTE — ED Provider Notes (Signed)
[facility]    CSN: [id number] Arrival date & time: 06/07/23  1512      History   Chief Complaint Chief Complaint  Patient presents with   Abdominal Pain    HPI Ethan Beck is a 47 y.o. male.   Patient presents to urgent care for evaluation of generalized abdominal pain, nausea, and vomiting that started this morning around 8am. He ate shrimp and grits last night and wonders if this could have caused symptoms. He has had 2 episodes of non-bilious/non-bloody emesis today. Last episode of emesis was 4 hours ago. No fever, chills, diarrhea, flank pain, urinary symptoms, headache, viral URI symptoms, or dizziness. Abdominal pain is generalized and currently 5/10, worsened with movement. Currently nauseous, has not been able to keep down any food/fluids since last episode of emesis at 1pm. No sick contacts or history of abdominal surgery. Last normal bowel movement was last night. Has not attempted treatment of symptoms PTA.    Abdominal Pain   History reviewed. No pertinent past medical history.  Patient Active Problem List   Diagnosis Date Noted   Right knee pain 05/07/2017    History reviewed. No pertinent surgical history.     Home Medications    Prior to Admission medications   Medication Sig Start Date End Date Taking? Authorizing Provider  ondansetron (ZOFRAN-ODT) 4 MG disintegrating tablet Take 1 tablet (4 mg total) by mouth every 8 (eight) hours as needed. 09/20/22   Tomi Bamberger, PA-C  dicyclomine (BENTYL) 20 MG tablet Take 1 tablet (20 mg total) by mouth 2 (two) times daily. Patient not taking: Reported on 09/19/2020 03/14/20 09/19/20  Lurline Idol    Family History Family History  Problem Relation Age of Onset   Diabetes Father     Social History Social History   Tobacco Use   Smoking status: Some Days    Types: Cigars   Smokeless tobacco: Never  Substance Use Topics   Alcohol use: Yes    Comment: socially   Drug use: No      Allergies   Patient has no known allergies.   Review of Systems Review of Systems  Gastrointestinal:  Positive for abdominal pain.  Per HPI   Physical Exam Triage Vital Signs ED Triage Vitals  Encounter Vitals Group     BP 06/07/23 1536 109/73     Systolic BP Percentile --      Diastolic BP Percentile --      Pulse Rate 06/07/23 1536 73     Resp 06/07/23 1536 18     Temp 06/07/23 1536 98.5 F (36.9 C)     Temp Source 06/07/23 1536 Oral     SpO2 06/07/23 1536 97 %     Weight 06/07/23 1533 270 lb (122.5 kg)     Height 06/07/23 1533 6' (1.829 m)     Head Circumference --      Peak Flow --      Pain Score 06/07/23 1533 5     Pain Loc --      Pain Education --      Exclude from Growth Chart --    No data found.  Updated Vital Signs BP 109/73 (BP Location: Left Arm)   Pulse 73   Temp 98.5 F (36.9 C) (Oral)   Resp 18   Ht 6' (1.829 m)   Wt 270 lb (122.5 kg)   SpO2 97%   BMI 36.62 kg/m   Visual Acuity Right Eye Distance:  Left Eye Distance:   Bilateral Distance:    Right Eye Near:   Left Eye Near:    Bilateral Near:     Physical Exam Vitals and nursing note reviewed.  Constitutional:      Appearance: He is not ill-appearing or toxic-appearing.  HENT:     Head: Normocephalic and atraumatic.     Right Ear: Hearing and external ear normal.     Left Ear: Hearing and external ear normal.     Nose: Nose normal.     Mouth/Throat:     Lips: Pink.  Eyes:     General: Lids are normal. Vision grossly intact. Gaze aligned appropriately.     Extraocular Movements: Extraocular movements intact.     Conjunctiva/sclera: Conjunctivae normal.  Cardiovascular:     Rate and Rhythm: Normal rate and regular rhythm.     Heart sounds: Normal heart sounds, S1 normal and S2 normal.  Pulmonary:     Effort: Pulmonary effort is normal. No respiratory distress.     Breath sounds: Normal breath sounds and air entry.  Abdominal:     General: Bowel sounds are normal.      Palpations: Abdomen is soft.     Tenderness: There is no abdominal tenderness. There is no right CVA tenderness, left CVA tenderness or guarding.     Comments: No peritoneal signs to exam.  Musculoskeletal:     Cervical back: Neck supple.  Skin:    General: Skin is warm and dry.     Capillary Refill: Capillary refill takes less than 2 seconds.     Findings: No rash.  Neurological:     General: No focal deficit present.     Mental Status: He is alert and oriented to person, place, and time. Mental status is at baseline.     Cranial Nerves: No dysarthria or facial asymmetry.  Psychiatric:        Mood and Affect: Mood normal.        Speech: Speech normal.        Behavior: Behavior normal.        Thought Content: Thought content normal.        Judgment: Judgment normal.      UC Treatments / Results  Labs (all labs ordered are listed, but only abnormal results are displayed) Labs Reviewed - No data to display  EKG   Radiology No results found.  Procedures Procedures (including critical care time)  Medications Ordered in UC Medications  ondansetron (ZOFRAN-ODT) disintegrating tablet 4 mg (has no administration in time range)    Initial Impression / Assessment and Plan / UC Course  I have reviewed the triage vital signs and the nursing notes.  Pertinent labs & imaging results that were available during my care of the patient were reviewed by me and considered in my medical decision making (see chart for details).   1. Viral gastroenteritis  Evaluation suggests viral gastrointestinal illness etiology.  Patient nontoxic appearing with hemodynamically stable vital signs, abdominal exam without peritoneal signs/focal tenderness. Will manage this with antiemetic (Zofran) as needed, OTC medicines as needed for discomfort/pain, increased fluids, and rest. Zofran given in clinic.  Liquid/bland diet initially, then increase diet as tolerated.   Counseled patient on potential  for adverse effects with medications prescribed/recommended today, strict ER and return-to-clinic precautions discussed, patient verbalized understanding.    Final Clinical Impressions(s) / UC Diagnoses   Final diagnoses:  Viral gastroenteritis     Discharge Instructions  Your evaluation suggests that your symptoms are most likely due to viral stomach illness (gastroenteritis/"stomach bug") which will improve on its own with rest and fluids in the next few days.   Take zofran to help with nausea every 8 hours as needed. You may use over the counter medicines for aches and pains such as tylenol as needed.  Start sipping on liquids (broth, water, gatorade, etc). If you are able to keep liquids down without vomiting for 1-2 hours, you may eat bland foods like jello, pudding, applesauce, bananas, rice, and white toast. Once you can tolerate blands, you may return to normal diet.   Pedialyte or gatorolyte may help to prevent/fix dehydration due to vomiting and diarrhea.  Please follow up with your primary care provider for further management. Return if you experience worsening or uncontrolled pain, inability to tolerate fluids by mouth, difficulty breathing, fevers 100.5F or greater, recurrent vomiting, or any other concerning symptoms.     ED Prescriptions   None    PDMP not reviewed this encounter.   Carlisle Beers, [place] 06/07/23 1743

## 2023-06-07 NOTE — ED Triage Notes (Signed)
Pt reports going out last night- ate shrimp/had a few drinks but not excessive. Woke up at 0800 with abd pain and has had 2 episodes of vomiting today. Last episode at 1pm when he tried to eat a saltine cracker. Denies sick contacts, denies fever

## 2023-07-12 ENCOUNTER — Ambulatory Visit
Admission: EM | Admit: 2023-07-12 | Discharge: 2023-07-12 | Disposition: A | Discharge status: Home / Self Care | Payer: Commercial Managed Care - PPO

## 2023-07-12 DIAGNOSIS — R112 Nausea with vomiting, unspecified: Secondary | ICD-10-CM | POA: Diagnosis not present

## 2023-07-12 DIAGNOSIS — R1084 Generalized abdominal pain: Secondary | ICD-10-CM

## 2023-07-12 NOTE — Discharge Instructions (Signed)
Take Zofran as needed.  Ensure adequate fluid hydration and bland diet as we discussed.  Follow-up with emergency department if symptoms persist or worsen.

## 2023-07-12 NOTE — ED Provider Notes (Signed)
[facility]    CSN: [id number] Arrival date & time: 07/12/23  1543      History   Chief Complaint Chief Complaint  Patient presents with   Emesis    HPI Ethan Beck is a 47 y.o. male.   Patient presents with abdominal pain, nausea, vomiting that started around 1 PM today.  Denies diarrhea.  Having normal bowel movements.  Denies any known sick contacts, recent unfavorable foods, travel outside of the [place].  He had some leftover Zofran which he took with some improvement in symptoms.  Reports he has not attempted to drink or eat anything.  Denies any associated fever.   Emesis   History reviewed. No pertinent past medical history.  Patient Active Problem List   Diagnosis Date Noted   Right knee pain 05/07/2017    History reviewed. No pertinent surgical history.     Home Medications    Prior to Admission medications   Medication Sig Start Date End Date Taking? Authorizing Provider  ondansetron (ZOFRAN-ODT) 4 MG disintegrating tablet Take 1 tablet (4 mg total) by mouth every 8 (eight) hours as needed. 09/20/22   Tomi Bamberger, PA-C  dicyclomine (BENTYL) 20 MG tablet Take 1 tablet (20 mg total) by mouth 2 (two) times daily. Patient not taking: Reported on 09/19/2020 03/14/20 09/19/20  Lurline Idol    Family History Family History  Problem Relation Age of Onset   Diabetes Father     Social History Social History   Tobacco Use   Smoking status: Some Days    Types: Cigars   Smokeless tobacco: Never  Substance Use Topics   Alcohol use: Yes    Comment: socially   Drug use: No     Allergies   Patient has no known allergies.   Review of Systems Review of Systems Per HPI  Physical Exam Triage Vital Signs ED Triage Vitals  Encounter Vitals Group     BP 07/12/23 1659 112/75     Systolic BP Percentile --      Diastolic BP Percentile --      Pulse Rate 07/12/23 1659 86     Resp 07/12/23 1659 16     Temp 07/12/23 1659  98.2 F (36.8 C)     Temp Source 07/12/23 1659 Oral     SpO2 07/12/23 1659 98 %     Weight 07/12/23 1658 270 lb (122.5 kg)     Height 07/12/23 1658 5\' 11"  (1.803 m)     Head Circumference --      Peak Flow --      Pain Score 07/12/23 1657 6     Pain Loc --      Pain Education --      Exclude from Growth Chart --    No data found.  Updated Vital Signs BP 112/75 (BP Location: Left Arm)   Pulse 86   Temp 98.2 F (36.8 C) (Oral)   Resp 16   Ht 5\' 11"  (1.803 m)   Wt 270 lb (122.5 kg)   SpO2 98%   BMI 37.66 kg/m   Visual Acuity Right Eye Distance:   Left Eye Distance:   Bilateral Distance:    Right Eye Near:   Left Eye Near:    Bilateral Near:     Physical Exam Constitutional:      General: He is not in acute distress.    Appearance: Normal appearance. He is not toxic-appearing or diaphoretic.  HENT:  Head: Normocephalic and atraumatic.     Mouth/Throat:     Mouth: Mucous membranes are moist.     Pharynx: No posterior oropharyngeal erythema.  Eyes:     Extraocular Movements: Extraocular movements intact.     Conjunctiva/sclera: Conjunctivae normal.  Cardiovascular:     Rate and Rhythm: Normal rate and regular rhythm.     Pulses: Normal pulses.     Heart sounds: Normal heart sounds.  Pulmonary:     Effort: Pulmonary effort is normal. No respiratory distress.     Breath sounds: Normal breath sounds.  Abdominal:     General: Bowel sounds are normal. There is no distension.     Palpations: Abdomen is soft.     Tenderness: There is no abdominal tenderness.  Neurological:     General: No focal deficit present.     Mental Status: He is alert and oriented to person, place, and time. Mental status is at baseline.  Psychiatric:        Mood and Affect: Mood normal.        Behavior: Behavior normal.        Thought Content: Thought content normal.        Judgment: Judgment normal.      UC Treatments / Results  Labs (all labs ordered are listed, but only  abnormal results are displayed) Labs Reviewed - No data to display  EKG   Radiology No results found.  Procedures Procedures (including critical care time)  Medications Ordered in UC Medications - No data to display  Initial Impression / Assessment and Plan / UC Course  I have reviewed the triage vital signs and the nursing notes.  Pertinent labs & imaging results that were available during my care of the patient were reviewed by me and considered in my medical decision making (see chart for details).     Differential diagnosis include viral versus food related illness.  There are no signs of acute abdomen or dehydration on exam so do not think that emergent evaluation is necessary.  Patient states that he has enough Zofran to take as needed.  Advised adequate fluid hydration and bland diet as well.  Advised strict ER precautions.  Patient verbalized understanding and was agreeable with plan. Final Clinical Impressions(s) / UC Diagnoses   Final diagnoses:  Nausea and vomiting, unspecified vomiting type  Generalized abdominal pain     Discharge Instructions      Take Zofran as needed.  Ensure adequate fluid hydration and bland diet as we discussed.  Follow-up with emergency department if symptoms persist or worsen.    ED Prescriptions   None    PDMP not reviewed this encounter.   Gustavus Bryant, [place] 07/12/23 1726

## 2023-07-12 NOTE — ED Triage Notes (Signed)
Patient here today with c/o abd pain, N&V that started at around 1 pm today. Patient states that he vomited twice today. He had some Zofran and he took one of them which seemed to have helped.

## 2023-08-30 ENCOUNTER — Ambulatory Visit
Admission: EM | Admit: 2023-08-30 | Discharge: 2023-08-30 | Disposition: A | Discharge status: Home / Self Care | Payer: Commercial Managed Care - PPO

## 2023-08-30 DIAGNOSIS — R112 Nausea with vomiting, unspecified: Secondary | ICD-10-CM

## 2023-08-30 MED ORDER — ONDANSETRON 4 MG PO TBDP
4.0000 mg | ORAL_TABLET | Freq: Once | ORAL | Status: AC
  Administered 2023-08-30: 4 mg via ORAL

## 2023-08-30 MED ORDER — ONDANSETRON 4 MG PO TBDP
4.0000 mg | ORAL_TABLET | Freq: Three times a day (TID) | ORAL | 0 refills | Status: AC | PRN
Start: 2023-08-30 — End: ?

## 2023-08-30 NOTE — ED Provider Notes (Signed)
[facility]    CSN: [id number] Arrival date & time: 08/30/23  0913      History   Chief Complaint Chief Complaint  Patient presents with   Emesis    HPI Ethan Beck is a 47 y.o. male.   Patient presents with nausea and vomiting that started about 24 hours ago.  Denies any diarrhea.  Patient having normal bowel movements.  Denies blood in stool or emesis.  Denies fever, body aches, chills.  Denies any known sick contacts, recent unfavorable foods, travel outside the [place].  Patient has had similar symptoms intermittently over the past few months.  He denies that he smokes marijuana.  Denies any previous history of gastrointestinal problems.  He has been having difficulty keeping food and fluids down.   Emesis   History reviewed. No pertinent past medical history.  Patient Active Problem List   Diagnosis Date Noted   Right knee pain 05/07/2017    History reviewed. No pertinent surgical history.     Home Medications    Prior to Admission medications   Medication Sig Start Date End Date Taking? Authorizing Provider  bismuth subsalicylate (PEPTO BISMOL) 262 MG/15ML suspension Take 30 mLs by mouth every 6 (six) hours as needed.   Yes [provider]  ondansetron (ZOFRAN-ODT) 4 MG disintegrating tablet Take 1 tablet (4 mg total) by mouth every 8 (eight) hours as needed for nausea or vomiting. 08/30/23  Yes Neesa [place], Acie Fredrickson, FNP  dicyclomine (BENTYL) 20 MG tablet Take 1 tablet (20 mg total) by mouth 2 (two) times daily. Patient not taking: Reported on 09/19/2020 03/14/20 09/19/20  Lurline Idol    Family History Family History  Problem Relation Age of Onset   Diabetes Father     Social History Social History   Tobacco Use   Smoking status: Some Days    Types: Cigars   Smokeless tobacco: Never  Vaping Use   Vaping status: Never Used  Substance Use Topics   Alcohol use: Yes    Comment: socially   Drug use: No     Allergies    Patient has no known allergies.   Review of Systems Review of Systems Per HPI  Physical Exam Triage Vital Signs ED Triage Vitals  Encounter Vitals Group     BP 08/30/23 1011 112/75     Systolic BP Percentile --      Diastolic BP Percentile --      Pulse Rate 08/30/23 1011 72     Resp 08/30/23 1011 18     Temp 08/30/23 1011 98.5 F (36.9 C)     Temp Source 08/30/23 1011 Oral     SpO2 08/30/23 1011 99 %     Weight 08/30/23 1010 270 lb (122.5 kg)     Height 08/30/23 1010 5\' 11"  (1.803 m)     Head Circumference --      Peak Flow --      Pain Score 08/30/23 1007 0     Pain Loc --      Pain Education --      Exclude from Growth Chart --    No data found.  Updated Vital Signs BP 112/75 (BP Location: Left Arm)   Pulse 72   Temp 98.5 F (36.9 C) (Oral)   Resp 18   Ht 5\' 11"  (1.803 m)   Wt 270 lb (122.5 kg)   SpO2 99%   BMI 37.66 kg/m   Visual Acuity Right Eye Distance:  Left Eye Distance:   Bilateral Distance:    Right Eye Near:   Left Eye Near:    Bilateral Near:     Physical Exam Constitutional:      General: He is not in acute distress.    Appearance: Normal appearance. He is not toxic-appearing or diaphoretic.  HENT:     Head: Normocephalic and atraumatic.     Mouth/Throat:     Mouth: Mucous membranes are moist.     Pharynx: No posterior oropharyngeal erythema.  Eyes:     Extraocular Movements: Extraocular movements intact.     Conjunctiva/sclera: Conjunctivae normal.  Cardiovascular:     Rate and Rhythm: Normal rate and regular rhythm.     Pulses: Normal pulses.     Heart sounds: Normal heart sounds.  Pulmonary:     Effort: Pulmonary effort is normal. No respiratory distress.     Breath sounds: Normal breath sounds.  Abdominal:     General: Bowel sounds are normal. There is no distension.     Palpations: Abdomen is soft.     Tenderness: There is no abdominal tenderness.  Neurological:     General: No focal deficit present.     Mental Status:  He is alert and oriented to person, place, and time. Mental status is at baseline.  Psychiatric:        Mood and Affect: Mood normal.        Behavior: Behavior normal.        Thought Content: Thought content normal.        Judgment: Judgment normal.      UC Treatments / Results  Labs (all labs ordered are listed, but only abnormal results are displayed) Labs Reviewed - No data to display  EKG   Radiology No results found.  Procedures Procedures (including critical care time)  Medications Ordered in UC Medications  ondansetron (ZOFRAN-ODT) disintegrating tablet 4 mg (4 mg Oral Given 08/30/23 1015)    Initial Impression / Assessment and Plan / UC Course  I have reviewed the triage vital signs and the nursing notes.  Pertinent labs & imaging results that were available during my care of the patient were reviewed by me and considered in my medical decision making (see chart for details).     Differential diagnoses include stomach virus versus food related illness.  Patient has had intermittent symptoms over the past few months so encouraged follow-up with gastrointestinal specialist at provided contact information to ensure this is not a chronic etiology.  There are no signs of acute abdomen or dehydration on exam that would warrant further evaluation or imaging of the abdomen.  Will prescribe ondansetron to take as needed for nausea and patient was advised to push oral fluids and ensure a bland diet.  Advised strict return and ER precautions.  Patient verbalized understanding and was agreeable with plan. Final Clinical Impressions(s) / UC Diagnoses   Final diagnoses:  Nausea and vomiting, unspecified vomiting type     Discharge Instructions      I have prescribed you nausea medication to take as needed.  Please ensure adequate fluids and bland diet.  Follow-up with stomach doctor if this continues to be a recurrent problem.  Go to the ER if these symptoms persist or  worsen.    ED Prescriptions     Medication Sig Dispense Auth. Provider   ondansetron (ZOFRAN-ODT) 4 MG disintegrating tablet Take 1 tablet (4 mg total) by mouth every 8 (eight) hours as needed for nausea or  vomiting. 20 tablet [place], Acie Fredrickson, [place]      PDMP not reviewed this encounter.   Gustavus Bryant, [place] 08/30/23 1157

## 2023-08-30 NOTE — Discharge Instructions (Signed)
I have prescribed you nausea medication to take as needed.  Please ensure adequate fluids and bland diet.  Follow-up with stomach doctor if this continues to be a recurrent problem.  Go to the ER if these symptoms persist or worsen.

## 2023-08-30 NOTE — ED Triage Notes (Signed)
"  I have been throwing up started about a day ago, last emesis this morning around 630 am". Last void "[id number]". Stools "normal, not loose or watery". Still some "Nausea". Stomach "ache" not pain.
